# Patient Record
Sex: Female | Born: 1995 | Hispanic: Yes | Marital: Single | Smoking: Never smoker
Health system: Southern US, Community
[De-identification: ages and names within clinical notes are randomized; demographics above are authoritative.]

## PROBLEM LIST (undated history)

## (undated) ENCOUNTER — Inpatient Hospital Stay (HOSPITAL_COMMUNITY): Payer: Self-pay

## (undated) DIAGNOSIS — Z789 Other specified health status: Secondary | ICD-10-CM

## (undated) HISTORY — PX: CLEFT PALATE REPAIR: SUR1165

---

## 2015-02-06 ENCOUNTER — Emergency Department (HOSPITAL_COMMUNITY)
Admission: EM | Admit: 2015-02-06 | Discharge: 2015-02-06 | Disposition: A | Discharge status: Home / Self Care | Payer: Worker's Compensation | Attending: Emergency Medicine | Admitting: Emergency Medicine

## 2015-02-06 ENCOUNTER — Encounter (HOSPITAL_COMMUNITY): Payer: Self-pay

## 2015-02-06 DIAGNOSIS — S61512A Laceration without foreign body of left wrist, initial encounter: Secondary | ICD-10-CM | POA: Insufficient documentation

## 2015-02-06 DIAGNOSIS — S6992XA Unspecified injury of left wrist, hand and finger(s), initial encounter: Secondary | ICD-10-CM | POA: Diagnosis present

## 2015-02-06 DIAGNOSIS — Y9289 Other specified places as the place of occurrence of the external cause: Secondary | ICD-10-CM | POA: Insufficient documentation

## 2015-02-06 DIAGNOSIS — Y9389 Activity, other specified: Secondary | ICD-10-CM | POA: Diagnosis not present

## 2015-02-06 DIAGNOSIS — Y990 Civilian activity done for income or pay: Secondary | ICD-10-CM | POA: Insufficient documentation

## 2015-02-06 DIAGNOSIS — W500XXA Accidental hit or strike by another person, initial encounter: Secondary | ICD-10-CM | POA: Diagnosis not present

## 2015-02-06 DIAGNOSIS — Z23 Encounter for immunization: Secondary | ICD-10-CM | POA: Diagnosis not present

## 2015-02-06 DIAGNOSIS — T148XXA Other injury of unspecified body region, initial encounter: Secondary | ICD-10-CM

## 2015-02-06 MED ORDER — TETANUS-DIPHTH-ACELL PERTUSSIS 5-2.5-18.5 LF-MCG/0.5 IM SUSP
0.5000 mL | Freq: Once | INTRAMUSCULAR | Status: AC
  Administered 2015-02-06: 0.5 mL via INTRAMUSCULAR
  Filled 2015-02-06: qty 0.5

## 2015-02-06 NOTE — Discharge Instructions (Signed)
Read the information below.  You may return to the Emergency Department at any time for worsening condition or any new symptoms that concern you.  If you develop redness, swelling, pus draining from the wound, or fevers greater than 100.4, return to the ER immediately for a recheck.    Keep your wound clean and covered with a thin layer of antibiotic ointment.
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
## 2016-04-17 ENCOUNTER — Inpatient Hospital Stay (HOSPITAL_COMMUNITY)
Admission: AD | Admit: 2016-04-17 | Discharge: 2016-04-17 | Disposition: A | Discharge status: Home / Self Care | Payer: Self-pay | Source: Ambulatory Visit | Attending: Obstetrics & Gynecology | Admitting: Obstetrics & Gynecology

## 2016-04-17 DIAGNOSIS — Z3189 Encounter for other procreative management: Secondary | ICD-10-CM | POA: Insufficient documentation

## 2016-04-17 DIAGNOSIS — Z3490 Encounter for supervision of normal pregnancy, unspecified, unspecified trimester: Secondary | ICD-10-CM

## 2016-04-17 NOTE — MAU Provider Note (Signed)
S: pt in for preg test. Denies any complaints. O: VSS, alert and oriented x 3.  A; stable for d/c P: explained to pt that since she had no complaints that preg test were no done in MAU. Pt give handout of physician practices and d/c home

## 2016-04-17 NOTE — MAU Note (Signed)
Pt presents to MAU stating that she wants to confirm her pregnancy. Positive pregnancy test on March the 19th. Denies any vaginal bleeding or pain

## 2016-04-18 ENCOUNTER — Ambulatory Visit (INDEPENDENT_AMBULATORY_CARE_PROVIDER_SITE_OTHER): Payer: Self-pay | Admitting: *Deleted

## 2016-04-18 DIAGNOSIS — Z3200 Encounter for pregnancy test, result unknown: Secondary | ICD-10-CM

## 2016-04-18 DIAGNOSIS — Z3202 Encounter for pregnancy test, result negative: Secondary | ICD-10-CM

## 2016-04-18 LAB — POCT PREGNANCY, URINE: Preg Test, Ur: NEGATIVE

## 2016-04-18 NOTE — Progress Notes (Signed)
Patient came in to the office for a pregnancy test. Patient stated her LMP was 2/18, we did her urine pregnancy test and it came back negative.Patient stated she had 4 positive home pregnancy test, So we sent patient to the lab to have a BHCG drawn. Also we reviewed meds with patient.

## 2016-04-19 LAB — BETA HCG QUANT (REF LAB): hCG Quant: 36 m[IU]/mL

## 2016-04-20 ENCOUNTER — Telehealth: Payer: Self-pay | Admitting: *Deleted

## 2016-04-20 NOTE — Telephone Encounter (Signed)
Called pt and informed her of + pregnancy blood test. Per Dr. Vergie LivingPickens, pt may have repeat BHCG or urine pregnancy test if she is concerned. Otherwise, she may schedule prenatal care as desired. Pt denies abdominal pain or vaginal bleeding.  She wanted to know how far along is the pregnancy. I responded approximately 3 weeks which is very early - too early to be seen by ultrasound. She was advised to go to MAU for evaluation if she develops abdominal pain and/or vaginal bleeding.  Pt voiced understanding of all information and had no additional questions.

## 2016-06-10 ENCOUNTER — Encounter (HOSPITAL_COMMUNITY): Payer: Self-pay | Admitting: Emergency Medicine

## 2016-06-10 ENCOUNTER — Emergency Department (HOSPITAL_COMMUNITY)
Admission: EM | Admit: 2016-06-10 | Discharge: 2016-06-10 | Disposition: A | Discharge status: Home / Self Care | Payer: Self-pay | Attending: Emergency Medicine | Admitting: Emergency Medicine

## 2016-06-10 ENCOUNTER — Emergency Department (HOSPITAL_COMMUNITY): Payer: Self-pay

## 2016-06-10 DIAGNOSIS — O0281 Inappropriate change in quantitative human chorionic gonadotropin (hCG) in early pregnancy: Secondary | ICD-10-CM | POA: Insufficient documentation

## 2016-06-10 DIAGNOSIS — Z3A12 12 weeks gestation of pregnancy: Secondary | ICD-10-CM | POA: Insufficient documentation

## 2016-06-10 DIAGNOSIS — O034 Incomplete spontaneous abortion without complication: Secondary | ICD-10-CM | POA: Insufficient documentation

## 2016-06-10 LAB — WET PREP, GENITAL
Sperm: NONE SEEN
Trich, Wet Prep: NONE SEEN
YEAST WET PREP: NONE SEEN

## 2016-06-10 LAB — ABO/RH: ABO/RH(D): O POS

## 2016-06-10 LAB — RPR: RPR Ser Ql: NONREACTIVE

## 2016-06-10 LAB — RAPID HIV SCREEN (HIV 1/2 AB+AG)
HIV 1/2 Antibodies: NONREACTIVE
HIV-1 P24 Antigen - HIV24: NONREACTIVE

## 2016-06-10 LAB — GC/CHLAMYDIA PROBE AMP (~~LOC~~) NOT AT ARMC
CHLAMYDIA, DNA PROBE: NEGATIVE
NEISSERIA GONORRHEA: NEGATIVE

## 2016-06-10 LAB — HCG, QUANTITATIVE, PREGNANCY: hCG, Beta Chain, Quant, S: 10396 m[IU]/mL — ABNORMAL HIGH (ref <5)

## 2016-06-10 MED ORDER — CEFTRIAXONE SODIUM 250 MG IJ SOLR
250.0000 mg | Freq: Once | INTRAMUSCULAR | Status: AC
  Administered 2016-06-10: 250 mg via INTRAMUSCULAR
  Filled 2016-06-10: qty 250

## 2016-06-10 MED ORDER — LIDOCAINE HCL (PF) 1 % IJ SOLN
INTRAMUSCULAR | Status: AC
  Filled 2016-06-10: qty 5

## 2016-06-10 MED ORDER — AZITHROMYCIN 250 MG PO TABS
1000.0000 mg | ORAL_TABLET | Freq: Once | ORAL | Status: AC
  Administered 2016-06-10: 1000 mg via ORAL
  Filled 2016-06-10: qty 4

## 2016-06-10 NOTE — ED Provider Notes (Signed)
[facility] Provider Note   CSN: [id number] Arrival date & time: 06/10/16  6213     History   Chief Complaint Chief Complaint  Patient presents with  . Vaginal Bleeding    HPI Sandra Castillo is a 21 y.o. female.  HPI   21 year old female who was approximately [redacted] weeks pregnant presenting complaining of vaginal bleeding. Patient reports since 1 PM yesterday while she was at work she experiencing intermittent low abdominal pain. She described the pain as a sharp sensation, 3 out of 10, lasting for seconds, comes and goes. This morning she noticed a small amount of vaginal bleeding which concerns her. She denies any associated fever, lightheadedness, dizziness, chest pain, trouble breathing, back pain, dysuria, hematuria, vaginal discharge, or rash. Her last menstrual period was in February. She has not had a formal ultrasound. She is a G1 P0. She denies any recent injury or trauma. No specific treatment tried.  History reviewed. No pertinent past medical history.  There are no active problems to display for this patient.   History reviewed. No pertinent surgical history.  OB History    No data available       Home Medications    Prior to Admission medications   Medication Sig Start Date End Date Taking? Authorizing Provider  acetaminophen (TYLENOL) 160 MG/5ML elixir Take 325 mg by mouth every 4 (four) hours as needed for fever or pain.    [provider]    Family History Family History  Problem Relation Age of Onset  . Hypertension Father     Social History Social History  Substance Use Topics  . Smoking status: Never Smoker  . Smokeless tobacco: Never Used  . Alcohol use No     Allergies   Patient has no known allergies.   Review of Systems Review of Systems  All other systems reviewed and are negative.    Physical Exam Updated Vital Signs BP 116/72   Pulse 100   Temp 98.4 F (36.9 C) (Oral)   Resp 20   Ht 5\' 1"  (1.549 m)    Wt 145 lb (65.8 kg)   SpO2 99%   BMI 27.40 kg/m   Physical Exam  Constitutional: She appears well-developed and well-nourished. No distress.  HENT:  Head: Atraumatic.  Eyes: Conjunctivae are normal.  Neck: Neck supple.  Cardiovascular: Normal rate and regular rhythm.   Pulmonary/Chest: Effort normal and breath sounds normal.  Abdominal: Soft. She exhibits no distension.  Gravid abdomen consistence with date  Genitourinary:  Genitourinary Comments: Chaperone present during exam. No inguinal lymphadenopathy or inguinal hernia noted. Normal external genitalia. Moderate amount of blood noted in vaginal vault with small amount of clot. No significant discomfort with speculum insertion. Close cervical os free of lesion or rash. On bimanual examination no adnexal tenderness or cervical motion tenderness.  Neurological: She is alert.  Skin: No rash noted.  Psychiatric: She has a normal mood and affect.  Nursing note and vitals reviewed.    ED Treatments / Results  Labs (all labs ordered are listed, but only abnormal results are displayed) Labs Reviewed  WET PREP, GENITAL - Abnormal; Notable for the following:       Result Value   Clue Cells Wet Prep HPF POC PRESENT (*)    WBC, Wet Prep HPF POC MODERATE (*)    All other components within normal limits  HCG, QUANTITATIVE, PREGNANCY - Abnormal; Notable for the following:    hCG, Beta Chain, Quant, S 10,396 (*)  All other components within normal limits  RAPID HIV SCREEN (HIV 1/2 AB+AG)  RPR  ABO/RH  GC/CHLAMYDIA PROBE AMP ([place]) NOT AT [facility]    EKG  EKG Interpretation None       Radiology US Ob Comp < 14 Wks  Result Date: 06/10/2016 CLINICAL DATA:  Vaginal bleeding since 5 a.m. EXAM: OBSTETRIC <14 WK [place] AND TRANSVAGINAL OB US TECHNIQUE: Both transabdominal and transvaginal ultrasound examinations were performed for complete evaluation of the gestation as well as the maternal uterus, adnexal regions, and pelvic  cul-de-sac. Transvaginal technique was performed to assess early pregnancy. COMPARISON:  None. FINDINGS: The endometrial canal is distended by heterogeneous material, measuring up to 27 mm. No normal gestational sac is seen. This is likely predominantly hemorrhage, no internal vascularity is noted to suggest soft tissue mass, placenta or molar pregnancy. No adnexal mass or pelvic fluid. IMPRESSION: 1. Heterogeneous material diffusely distends the endometrial cavity by nearly 3 cm. The material is nonvascular and predominately attributed to hematoma. Placenta or other products of conception could easily be obscured, recommend follow-up. 2. No normal gestational sac identified. 3. No adnexal mass or pelvic fluid. Electronically Signed   By: Marnee Spring M.D.   On: 06/10/2016 11:42   US Ob Transvaginal  Result Date: 06/10/2016 CLINICAL DATA:  Vaginal bleeding since 5 a.m. EXAM: OBSTETRIC <14 WK [place] AND TRANSVAGINAL OB US TECHNIQUE: Both transabdominal and transvaginal ultrasound examinations were performed for complete evaluation of the gestation as well as the maternal uterus, adnexal regions, and pelvic cul-de-sac. Transvaginal technique was performed to assess early pregnancy. COMPARISON:  None. FINDINGS: The endometrial canal is distended by heterogeneous material, measuring up to 27 mm. No normal gestational sac is seen. This is likely predominantly hemorrhage, no internal vascularity is noted to suggest soft tissue mass, placenta or molar pregnancy. No adnexal mass or pelvic fluid. IMPRESSION: 1. Heterogeneous material diffusely distends the endometrial cavity by nearly 3 cm. The material is nonvascular and predominately attributed to hematoma. Placenta or other products of conception could easily be obscured, recommend follow-up. 2. No normal gestational sac identified. 3. No adnexal mass or pelvic fluid. Electronically Signed   By: Marnee Spring M.D.   On: 06/10/2016 11:42    Procedures Procedures  (including critical care time)  Medications Ordered in ED Medications  cefTRIAXone (ROCEPHIN) injection 250 mg (not administered)  azithromycin (ZITHROMAX) tablet 1,000 mg (not administered)     Initial Impression / Assessment and Plan / ED Course  I have reviewed the triage vital signs and the nursing notes.  Pertinent labs & imaging results that were available during my care of the patient were reviewed by me and considered in my medical decision making (see chart for details).     BP 118/76   Pulse 87   Temp 98.4 F (36.9 C) (Oral)   Resp 17   Ht 5\' 1"  (1.549 m)   Wt 145 lb (65.8 kg)   LMP 03/13/2016 (Exact Date)   SpO2 100%   BMI 27.40 kg/m    Final Clinical Impressions(s) / ED Diagnoses   Final diagnoses:  Incomplete abortion    New Prescriptions New Prescriptions   No medications on file   6:57 AM Vaginal bleeding in first trimester.  Work up initiated.  A perform a screening bedside ultrasound. I was able to identify the uterus, but unable to visualize the fetus. Patient is stable, workup initiated. Will obtain formal ultrasound.  12:12 PM Beta hCG is 10,396 which is lower  than expected progression. Patient is Rh+. Wet prep shows persistent clue cells, moderate amount of WBC. Patient will be getting Rocephin and Zithromax for potential STD.  patient ultrasound demonstrate heterogeneous material diffusely distended in the endometrial cavity by nearly 3 cm.cyst likely a hematoma. Placenta or products of conception could easily be obscured. Recommend follow-up. No normal gestational sac identified. This finding was discussed with patient. She does have a appointment with an OB/GYN at the health clinic on Monday which is 3 days now. I encouraged patient to follow-up for a recheck. All questions answered to patient's satisfaction. She is stable for discharge.    Fayrene Helperran, Rosamae Rocque, PA-C 06/10/16 1217    Gerhard MunchLockwood, Robert, MD 06/10/16 [phone]

## 2016-06-10 NOTE — Discharge Instructions (Signed)
Your finding is concerning for incomplete miscarriage.  Please follow up with your schedule appointment on Monday for reevaluation.

## 2016-06-10 NOTE — ED Triage Notes (Signed)
Pt is approx [redacted] weeks pregnant and started experiencing vaginal bleeding around 0500 this am. Pt is having pelvic pain also

## 2016-06-10 NOTE — ED Notes (Signed)
Patient transported to Ultrasound 

## 2016-06-13 ENCOUNTER — Inpatient Hospital Stay (HOSPITAL_COMMUNITY)
Admission: AD | Admit: 2016-06-13 | Discharge: 2016-06-13 | Disposition: A | Discharge status: Home / Self Care | Payer: Self-pay | Source: Ambulatory Visit | Attending: Obstetrics & Gynecology | Admitting: Obstetrics & Gynecology

## 2016-06-13 ENCOUNTER — Encounter (HOSPITAL_COMMUNITY): Payer: Self-pay | Admitting: *Deleted

## 2016-06-13 DIAGNOSIS — O039 Complete or unspecified spontaneous abortion without complication: Secondary | ICD-10-CM | POA: Insufficient documentation

## 2016-06-13 DIAGNOSIS — O209 Hemorrhage in early pregnancy, unspecified: Secondary | ICD-10-CM

## 2016-06-13 DIAGNOSIS — Z3A13 13 weeks gestation of pregnancy: Secondary | ICD-10-CM | POA: Insufficient documentation

## 2016-06-13 HISTORY — DX: Other specified health status: Z78.9

## 2016-06-13 LAB — CBC
HCT: 37.5 % (ref 36.0–46.0)
Hemoglobin: 12.9 g/dL (ref 12.0–15.0)
MCH: 31.2 pg (ref 26.0–34.0)
MCHC: 34.4 g/dL (ref 30.0–36.0)
MCV: 90.6 fL (ref 78.0–100.0)
PLATELETS: 294 10*3/uL (ref 150–400)
RBC: 4.14 MIL/uL (ref 3.87–5.11)
RDW: 12.8 % (ref 11.5–15.5)
WBC: 8.8 10*3/uL (ref 4.0–10.5)

## 2016-06-13 LAB — HCG, QUANTITATIVE, PREGNANCY: hCG, Beta Chain, Quant, S: 4092 m[IU]/mL — ABNORMAL HIGH (ref <5)

## 2016-06-13 MED ORDER — IBUPROFEN 600 MG PO TABS
600.0000 mg | ORAL_TABLET | Freq: Once | ORAL | Status: AC
  Administered 2016-06-13: 600 mg via ORAL
  Filled 2016-06-13: qty 1

## 2016-06-13 MED ORDER — IBUPROFEN 600 MG PO TABS: 600.0000 mg | ORAL_TABLET | Freq: Once | ORAL | 0 refills | Status: AC

## 2016-06-13 NOTE — MAU Note (Signed)
[facility] . Was told she had an incomplete miscarriage. Went to appointment wtih health department today and they sent her here  Because she was still having pain and bleeding.

## 2016-06-13 NOTE — MAU Provider Note (Signed)
History     CSN: [id number]  Arrival date and time: 06/13/16 1024   First Provider Initiated Contact with Patient 06/13/16 1225      Chief Complaint  Patient presents with  . Vaginal Bleeding  . Miscarriage   HPI   Ms.Sandra Castillo is a 21 y.o. female G1P0 @ 3951w1d here in MAU with vaginal bleeding and concerns about miscarriage. She presented to the ED on 5/18 with vaginal bleeding similar to a menstrual cycle She had an episode of lower abdominal pain at the onset of bleeding. She was told that she was likely having a miscarriage and needed to be seen by a OBGYN. She had already had an appointment with the Health Department today to establish prenatal care. She informed them that she continued to have vaginal bleeding and cramping and they recommended she come to MAU for further evaluation.   She continues to have dark red vaginal bleeding. She denies dizziness. She continues to have lower abdominal pain that is mild. She has not taken anything for pain recently.   OB History    Gravida Para Term Preterm AB Living   1             SAB TAB Ectopic Multiple Live Births                  Past Medical History:  Diagnosis Date  . Medical history non-contributory     No past surgical history on file.  Family History  Problem Relation Age of Onset  . Hypertension Father     Social History  Substance Use Topics  . Smoking status: Never Smoker  . Smokeless tobacco: Never Used  . Alcohol use No    Allergies: No Known Allergies  Prescriptions Prior to Admission  Medication Sig Dispense Refill Last Dose  . Prenatal Vit-Fe Fumarate-FA (PRENATAL MULTIVITAMIN) TABS tablet Take 1 tablet by mouth daily at 12 noon.   Past Week at Unknown time   Results for orders placed or performed during the hospital encounter of 06/13/16 (from the past 48 hour(s))  hCG, quantitative, pregnancy     Status: Abnormal   Collection Time: 06/13/16 12:34 PM  Result Value Ref Range   hCG, Beta  Chain, Quant, S 4,092 (H) <5 mIU/mL    Comment:          GEST. AGE      CONC.  (mIU/mL)   <=1 WEEK        5 - 50     2 WEEKS       50 - 500     3 WEEKS       100 - 10,000     4 WEEKS     1,000 - 30,000     5 WEEKS     3,500 - 115,000   6-8 WEEKS     12,000 - 270,000    12 WEEKS     15,000 - 220,000        FEMALE AND NON-PREGNANT FEMALE:     LESS THAN 5 mIU/mL   CBC     Status: None   Collection Time: 06/13/16 12:34 PM  Result Value Ref Range   WBC 8.8 4.0 - 10.5 K/uL   RBC 4.14 3.87 - 5.11 MIL/uL   Hemoglobin 12.9 12.0 - 15.0 g/dL   HCT 62.937.5 52.836.0 - 41.346.0 %   MCV 90.6 78.0 - 100.0 fL   MCH 31.2 26.0 - 34.0 pg   MCHC 34.4 30.0 -  36.0 g/dL   RDW 84.6 96.2 - 95.2 %   Platelets 294 150 - 400 K/uL    US Ob Comp < 14 Wks  Result Date: 06/10/2016 CLINICAL DATA:  Vaginal bleeding since 5 a.m. EXAM: OBSTETRIC <14 WK [place] AND TRANSVAGINAL OB US TECHNIQUE: Both transabdominal and transvaginal ultrasound examinations were performed for complete evaluation of the gestation as well as the maternal uterus, adnexal regions, and pelvic cul-de-sac. Transvaginal technique was performed to assess early pregnancy. COMPARISON:  None. FINDINGS: The endometrial canal is distended by heterogeneous material, measuring up to 27 mm. No normal gestational sac is seen. This is likely predominantly hemorrhage, no internal vascularity is noted to suggest soft tissue mass, placenta or molar pregnancy. No adnexal mass or pelvic fluid. IMPRESSION: 1. Heterogeneous material diffusely distends the endometrial cavity by nearly 3 cm. The material is nonvascular and predominately attributed to hematoma. Placenta or other products of conception could easily be obscured, recommend follow-up. 2. No normal gestational sac identified. 3. No adnexal mass or pelvic fluid. Electronically Signed   By: Marnee Spring M.D.   On: 06/10/2016 11:42   US Ob Transvaginal  Result Date: 06/10/2016 CLINICAL DATA:  Vaginal bleeding since 5 a.m.  EXAM: OBSTETRIC <14 WK [place] AND TRANSVAGINAL OB US TECHNIQUE: Both transabdominal and transvaginal ultrasound examinations were performed for complete evaluation of the gestation as well as the maternal uterus, adnexal regions, and pelvic cul-de-sac. Transvaginal technique was performed to assess early pregnancy. COMPARISON:  None. FINDINGS: The endometrial canal is distended by heterogeneous material, measuring up to 27 mm. No normal gestational sac is seen. This is likely predominantly hemorrhage, no internal vascularity is noted to suggest soft tissue mass, placenta or molar pregnancy. No adnexal mass or pelvic fluid. IMPRESSION: 1. Heterogeneous material diffusely distends the endometrial cavity by nearly 3 cm. The material is nonvascular and predominately attributed to hematoma. Placenta or other products of conception could easily be obscured, recommend follow-up. 2. No normal gestational sac identified. 3. No adnexal mass or pelvic fluid. Electronically Signed   By: Marnee Spring M.D.   On: 06/10/2016 11:42   Review of Systems  Constitutional: Negative for fever.  Gastrointestinal: Positive for abdominal pain.  Genitourinary: Positive for vaginal bleeding.  Neurological: Negative for dizziness.   Physical Exam   Blood pressure 124/73, pulse 67, temperature 98.3 F (36.8 C), temperature source Oral, resp. rate 16, height 5\' 2"  (1.575 m), weight 144 lb (65.3 kg), last menstrual period 03/13/2016, SpO2 100 %.  Physical Exam  Constitutional: She is oriented to person, place, and time. She appears well-developed and well-nourished. No distress.  HENT:  Head: Normocephalic.  Eyes: Pupils are equal, round, and reactive to light.  GI: Soft. She exhibits no distension. There is no tenderness. There is no rebound.  Genitourinary:  Genitourinary Comments: Vagina - Small amount of dark red blood noted in vaginal canal  Cervix - small amount of active bleeding  Bimanual exam: Cervix closed Uterus  non tender, enlarged  Chaperone present for exam.   Musculoskeletal: Normal range of motion.  Neurological: She is alert and oriented to person, place, and time.  Skin: Skin is warm. She is not diaphoretic.  Psychiatric: Her behavior is normal.   MAU Course  Procedures  None  MDM  ED records from 5/18 fully reviewed Quant 5/18: 10,396 Quant today 5/21: 4,092 Hgb stable today  Ibuprofen 600 mg PO given in MAU.  O positive blood type   Assessment and Plan   A:  1. SAB (spontaneous abortion)   2. Vaginal bleeding in pregnancy, first trimester     P:  Discharge home in stable condition Message sent to the [facility] for follow up in 1 week  for quant  Bleeding precautions Return to MAU if symptoms worsen Pelvic rest  Support given    Venia Carbon I, NP 06/13/2016 8:06 PM

## 2016-06-13 NOTE — Discharge Instructions (Signed)

## 2016-06-13 NOTE — MAU Note (Signed)
Urine in lab 

## 2016-06-21 ENCOUNTER — Other Ambulatory Visit: Payer: Self-pay

## 2016-06-27 ENCOUNTER — Ambulatory Visit (INDEPENDENT_AMBULATORY_CARE_PROVIDER_SITE_OTHER): Payer: Self-pay | Admitting: Family Medicine

## 2016-06-27 ENCOUNTER — Encounter: Payer: Self-pay | Admitting: Family Medicine

## 2016-06-27 VITALS — BP 122/72 | HR 82 | Ht 61.0 in | Wt 146.3 lb

## 2016-06-27 DIAGNOSIS — O039 Complete or unspecified spontaneous abortion without complication: Secondary | ICD-10-CM

## 2016-06-27 NOTE — Progress Notes (Signed)
   Subjective:    Patient ID: Sandra Castillo is a 21 y.o. female presenting with Follow-up (sab)  on 06/27/2016  HPI: Still having some bleeding from her miscarriage. Works as [employer] for [employer]. [facility] [employer] [facility] education. Not interested in contraception at this time due to side effects.  Review of Systems  Constitutional: Negative for chills and fever.  Respiratory: Negative for shortness of breath.   Cardiovascular: Negative for chest pain.  Gastrointestinal: Negative for abdominal pain, nausea and vomiting.  Genitourinary: Negative for dysuria.  Skin: Negative for rash.      Objective:    BP 122/72   Pulse 82   Ht 5\' 1"  (1.549 m)   Wt 146 lb 4.8 oz (66.4 kg)   LMP 03/13/2016   Breastfeeding? No   BMI 27.64 kg/m  Physical Exam  Constitutional: She is oriented to person, place, and time. She appears well-developed and well-nourished. No distress.  HENT:  Head: Normocephalic and atraumatic.  Eyes: No scleral icterus.  Neck: Neck supple.  Cardiovascular: Normal rate.   Pulmonary/Chest: Effort normal.  Abdominal: Soft.  Neurological: She is alert and oriented to person, place, and time.  Skin: Skin is warm and dry.  Psychiatric: She has a normal mood and affect.      Assessment & Plan:  Miscarriage - needs pap and CPE--apply for Medicaid FPW, declines contraception but info given--revisit at next visit.   Total face-to-face time with patient: 15 minutes. Over 50% of encounter was spent on counseling and coordination of care. Return in about 4 weeks (around 07/25/2016) for a CPE.  Reva Boresanya S Dianne Bady 06/27/2016 3:53 PM

## 2016-06-27 NOTE — Patient Instructions (Signed)

## 2016-07-04 ENCOUNTER — Encounter: Payer: Self-pay | Admitting: Family Medicine

## 2016-07-19 ENCOUNTER — Encounter: Payer: Self-pay | Admitting: Family Medicine

## 2016-07-25 ENCOUNTER — Ambulatory Visit: Payer: Self-pay | Admitting: Family Medicine

## 2016-07-26 ENCOUNTER — Ambulatory Visit (INDEPENDENT_AMBULATORY_CARE_PROVIDER_SITE_OTHER): Payer: Self-pay | Admitting: Advanced Practice Midwife

## 2016-07-26 ENCOUNTER — Encounter: Payer: Self-pay | Admitting: Advanced Practice Midwife

## 2016-07-26 VITALS — BP 119/79 | HR 111 | Wt 133.5 lb

## 2016-07-26 DIAGNOSIS — O039 Complete or unspecified spontaneous abortion without complication: Secondary | ICD-10-CM

## 2016-07-26 DIAGNOSIS — N939 Abnormal uterine and vaginal bleeding, unspecified: Secondary | ICD-10-CM

## 2016-07-26 NOTE — Progress Notes (Signed)
Patient ID: Sandra Castillo, female   DOB: 02/26/1995, 21 y.o.   MRN: [id number]  Chief Complaint  Patient presents with  . Vaginal Bleeding    HPI  Sandra Castillo is a 21 y.o. female. She presents today for follow up after a SAB. She reports that she has had bleeding every day since she was seen 06/27/16. She states that it has been spotting, but about 3 days ago it became heavier. She states that it is less than a period at this time. She denies passing any clots or tissue. She denies any pain.    Vaginal Bleeding  The patient's primary symptoms include vaginal bleeding. The patient's pertinent negatives include no pelvic pain or vaginal discharge. This is a new problem. The current episode started more than 1 month ago. The problem occurs constantly. The problem has been waxing and waning. The patient is experiencing no pain. She is not pregnant (recent SAB). Pertinent negatives include no chills, dysuria, fever, nausea or vomiting. The vaginal bleeding is lighter than menses. She has not been passing clots. She has not been passing tissue. Nothing aggravates the symptoms. She has tried nothing for the symptoms. She uses nothing for contraception.    Past Medical History:  Diagnosis Date  . Medical history non-contributory     No past surgical history on file.  Family History  Problem Relation Age of Onset  . Hypertension Father     Social History Social History  Substance Use Topics  . Smoking status: Never Smoker  . Smokeless tobacco: Never Used  . Alcohol use No    No Known Allergies  Current Outpatient Prescriptions  Medication Sig Dispense Refill  . Prenatal Vit-Fe Fumarate-FA (PRENATAL MULTIVITAMIN) TABS tablet Take 1 tablet by mouth daily at 12 noon.     No current facility-administered medications for this visit.     Review of Systems Review of Systems  Constitutional: Negative for chills and fever.  Gastrointestinal: Negative for nausea and vomiting.   Genitourinary: Positive for vaginal bleeding. Negative for dysuria, pelvic pain and vaginal discharge.    Blood pressure 119/79, pulse (!) 111, weight 133 lb 8 oz (60.6 kg), last menstrual period 03/13/2016.  Physical Exam Physical Exam  Constitutional: She is oriented to person, place, and time. She appears well-developed and well-nourished. No distress.  HENT:  Head: Normocephalic.  Cardiovascular: Normal rate.   Pulmonary/Chest: Effort normal.  Abdominal: Soft. There is no tenderness. There is no rebound.  Genitourinary:  Genitourinary Comments:  External: no lesion Vagina: small amount of pink blood seen  Cervix: pink, smooth, no CMT Uterus: NSSC Adnexa: NT  Neurological: She is alert and oriented to person, place, and time.  Skin: Skin is warm and dry.  Psychiatric: She has a normal mood and affect.  Nursing note and vitals reviewed.   Data Reviewed Reviewed labs from previous visit.   Assessment    1. Abnormal uterine bleeding   2. SAB (spontaneous abortion)        Plan    HCG today Will call patient with results May need US if HCG still present to look for retained POCs If no HCG reviewed with the patient that bleeding may be irregular after a SAB, and will continue to monitor.  Patient given information on free pap clinic, pap deferred today as patient does not have insurance.        Thressa ShellerHeather Tyjanae Bartek 07/26/2016, 2:27 PM

## 2016-07-26 NOTE — Progress Notes (Signed)
Free Pap Screening info given to pt  

## 2016-07-27 LAB — BETA HCG QUANT (REF LAB)

## 2016-07-28 ENCOUNTER — Encounter: Payer: Self-pay | Admitting: Advanced Practice Midwife

## 2016-08-04 ENCOUNTER — Ambulatory Visit: Payer: Self-pay | Admitting: Family Medicine

## 2017-01-24 NOTE — L&D Delivery Note (Signed)
[facility] Delivery Note  Sandra Castillo is a 22 y.o. G2P0010 s/p SVD at [redacted]w[redacted]d. She was admitted for spontaneous onset of labor.   ROM: 0h 59m with clear fluid GBS Status: negative Maximum Maternal Temperature: Temp (24hrs), Avg:98.2 F (36.8 C), Min:98.2 F (36.8 C), Max:98.2 F (36.8 C)  Labor Progress: . Admitted in active labor . Progressed to complete with AROM just prior to pushing   Delivery Date/Time: 10/24/17 at 0309  Delivery: AROM in room and then head to +2 station and patient with urge to push. Remained in room during pushing which was for about 30 minutes under no anesthesia. Head delivered ROA. No nuchal cord present but loose body cord. Shoulder and body delivered in usual fashion. Infant with spontaneous cry, placed on mother's abdomen, dried and stimulated. Cord clamped x 2 after 1-minute delay, and cut by maternal grandmother of baby. Cord blood drawn. Placenta delivered spontaneously with gentle cord traction. Fundus firm with massage and Pitocin. Labia, perineum, vagina, and cervix inspected inspected with 2nd degree and right periurethral lacerations.   Placenta: spontaneous, intact, 3-vessel cord Complications: none Lacerations: 2nd degree, periurethral repaired with 3-0 Vicryl and 4-0 rapide  EBL: 785cc - PPH related to tone, lacerations, small clots in lower uterine segment expelled with fundal massage   uterus firm with postpartum pitocin after completion of repair   Postpartum Planning [x]  message to sent to schedule follow-up  [x]  vaccines UTD  Infant: Vigorous female  APGARs 52, 75   3161g  Sandra Castillo S. Earlene Plater, DO OB/GYN Fellow, Faculty [facility]

## 2017-03-27 ENCOUNTER — Emergency Department (HOSPITAL_COMMUNITY): Payer: No Typology Code available for payment source

## 2017-03-27 ENCOUNTER — Emergency Department (HOSPITAL_COMMUNITY)
Admission: EM | Admit: 2017-03-27 | Discharge: 2017-03-27 | Disposition: A | Discharge status: Home / Self Care | Payer: No Typology Code available for payment source | Attending: Emergency Medicine | Admitting: Emergency Medicine

## 2017-03-27 ENCOUNTER — Encounter (HOSPITAL_COMMUNITY): Payer: Self-pay | Admitting: Emergency Medicine

## 2017-03-27 DIAGNOSIS — Y92410 Unspecified street and highway as the place of occurrence of the external cause: Secondary | ICD-10-CM | POA: Diagnosis not present

## 2017-03-27 DIAGNOSIS — S60221A Contusion of right hand, initial encounter: Secondary | ICD-10-CM | POA: Diagnosis not present

## 2017-03-27 DIAGNOSIS — Y999 Unspecified external cause status: Secondary | ICD-10-CM | POA: Insufficient documentation

## 2017-03-27 DIAGNOSIS — Y9389 Activity, other specified: Secondary | ICD-10-CM | POA: Diagnosis not present

## 2017-03-27 DIAGNOSIS — S6000XA Contusion of unspecified finger without damage to nail, initial encounter: Secondary | ICD-10-CM

## 2017-03-27 DIAGNOSIS — S6991XA Unspecified injury of right wrist, hand and finger(s), initial encounter: Secondary | ICD-10-CM | POA: Diagnosis present

## 2017-03-27 DIAGNOSIS — S63619A Unspecified sprain of unspecified finger, initial encounter: Secondary | ICD-10-CM | POA: Diagnosis not present

## 2017-03-27 NOTE — ED Notes (Signed)
Discharge instructions reviewed with patient. Patient verbalizes understanding. VSS.   

## 2017-03-27 NOTE — Discharge Instructions (Signed)
Use tylenol for pain, this is the only medication for pain that is safe in pregnancy. You can buddy tape your fingers for comfort. Ice and elevate your hand/fingers to help with pain/swelling. Use the ice pack for no more than 20 minutes at a time every hour. Expect to be sore for the next few days and follow up with your primary care physician for recheck of ongoing symptoms in the next 1week. Return to ER for emergent changing or worsening of symptoms.
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
## 2017-04-13 ENCOUNTER — Other Ambulatory Visit (HOSPITAL_COMMUNITY): Payer: Self-pay | Admitting: Nurse Practitioner

## 2017-04-13 DIAGNOSIS — Z3682 Encounter for antenatal screening for nuchal translucency: Secondary | ICD-10-CM

## 2017-04-13 LAB — OB RESULTS CONSOLE RPR: RPR: NONREACTIVE

## 2017-04-13 LAB — OB RESULTS CONSOLE ABO/RH: RH TYPE: POSITIVE

## 2017-04-13 LAB — OB RESULTS CONSOLE RUBELLA ANTIBODY, IGM: Rubella: NON-IMMUNE/NOT IMMUNE

## 2017-04-13 LAB — OB RESULTS CONSOLE HIV ANTIBODY (ROUTINE TESTING): HIV: NONREACTIVE

## 2017-04-13 LAB — OB RESULTS CONSOLE ANTIBODY SCREEN: Antibody Screen: NEGATIVE

## 2017-04-13 LAB — OB RESULTS CONSOLE GC/CHLAMYDIA
Chlamydia: NEGATIVE
Gonorrhea: NEGATIVE

## 2017-04-13 LAB — OB RESULTS CONSOLE HEPATITIS B SURFACE ANTIGEN: Hepatitis B Surface Ag: NEGATIVE

## 2017-04-26 ENCOUNTER — Encounter (HOSPITAL_COMMUNITY): Payer: Self-pay | Admitting: *Deleted

## 2017-04-28 ENCOUNTER — Ambulatory Visit (HOSPITAL_COMMUNITY)
Admission: RE | Admit: 2017-04-28 | Discharge: 2017-04-28 | Disposition: A | Discharge status: Home / Self Care | Payer: Self-pay | Source: Ambulatory Visit | Attending: Nurse Practitioner | Admitting: Nurse Practitioner

## 2017-04-28 ENCOUNTER — Encounter (HOSPITAL_COMMUNITY): Payer: Self-pay

## 2017-04-28 DIAGNOSIS — Z315 Encounter for genetic counseling: Secondary | ICD-10-CM | POA: Insufficient documentation

## 2017-04-28 DIAGNOSIS — Z3A12 12 weeks gestation of pregnancy: Secondary | ICD-10-CM | POA: Insufficient documentation

## 2017-04-28 DIAGNOSIS — Z87730 Personal history of (corrected) cleft lip and palate: Secondary | ICD-10-CM | POA: Insufficient documentation

## 2017-04-28 DIAGNOSIS — Z87798 Personal history of other (corrected) congenital malformations: Secondary | ICD-10-CM | POA: Insufficient documentation

## 2017-04-28 DIAGNOSIS — Z3682 Encounter for antenatal screening for nuchal translucency: Secondary | ICD-10-CM | POA: Insufficient documentation
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
## 2017-05-12 ENCOUNTER — Other Ambulatory Visit (HOSPITAL_COMMUNITY): Payer: Self-pay

## 2017-10-10 LAB — OB RESULTS CONSOLE GBS: STREP GROUP B AG: NEGATIVE

## 2017-10-23 ENCOUNTER — Inpatient Hospital Stay (HOSPITAL_COMMUNITY)
Admission: AD | Admit: 2017-10-23 | Discharge: 2017-10-26 | DRG: 806 | Disposition: A | Discharge status: Home / Self Care | Payer: Medicaid Other | Attending: Obstetrics and Gynecology | Admitting: Obstetrics and Gynecology

## 2017-10-23 DIAGNOSIS — Z3A38 38 weeks gestation of pregnancy: Secondary | ICD-10-CM

## 2017-10-23 DIAGNOSIS — O479 False labor, unspecified: Secondary | ICD-10-CM | POA: Diagnosis present

## 2017-10-23 DIAGNOSIS — Z87730 Personal history of (corrected) cleft lip and palate: Secondary | ICD-10-CM

## 2017-10-23 DIAGNOSIS — Z23 Encounter for immunization: Secondary | ICD-10-CM

## 2017-10-24 ENCOUNTER — Encounter (HOSPITAL_COMMUNITY): Payer: Self-pay | Admitting: *Deleted

## 2017-10-24 ENCOUNTER — Other Ambulatory Visit: Payer: Self-pay

## 2017-10-24 DIAGNOSIS — Z3A38 38 weeks gestation of pregnancy: Secondary | ICD-10-CM | POA: Diagnosis not present

## 2017-10-24 DIAGNOSIS — O479 False labor, unspecified: Secondary | ICD-10-CM | POA: Diagnosis present

## 2017-10-24 DIAGNOSIS — Z3483 Encounter for supervision of other normal pregnancy, third trimester: Secondary | ICD-10-CM | POA: Diagnosis present

## 2017-10-24 DIAGNOSIS — Z23 Encounter for immunization: Secondary | ICD-10-CM | POA: Diagnosis not present

## 2017-10-24 LAB — CBC
HCT: 35.6 % — ABNORMAL LOW (ref 36.0–46.0)
HEMOGLOBIN: 12.6 g/dL (ref 12.0–15.0)
MCH: 32.7 pg (ref 26.0–34.0)
MCHC: 35.4 g/dL (ref 30.0–36.0)
MCV: 92.5 fL (ref 78.0–100.0)
Platelets: 252 10*3/uL (ref 150–400)
RBC: 3.85 MIL/uL — AB (ref 3.87–5.11)
RDW: 12.8 % (ref 11.5–15.5)
WBC: 12.7 10*3/uL — AB (ref 4.0–10.5)

## 2017-10-24 LAB — ABO/RH: ABO/RH(D): O POS

## 2017-10-24 LAB — TYPE AND SCREEN
ABO/RH(D): O POS
ANTIBODY SCREEN: NEGATIVE

## 2017-10-24 LAB — RPR: RPR Ser Ql: NONREACTIVE

## 2017-10-24 MED ORDER — LIDOCAINE HCL (PF) 1 % IJ SOLN
30.0000 mL | INTRAMUSCULAR | Status: DC | PRN
Start: 2017-10-24 — End: 2017-10-26
  Filled 2017-10-24: qty 30

## 2017-10-24 MED ORDER — ONDANSETRON HCL 4 MG/2ML IJ SOLN: 4.0000 mg | INTRAMUSCULAR | Status: DC | PRN

## 2017-10-24 MED ORDER — LACTATED RINGERS IV SOLN: INTRAVENOUS | Status: DC

## 2017-10-24 MED ORDER — OXYTOCIN 40 UNITS IN LACTATED RINGERS INFUSION - SIMPLE MED: 2.5000 [IU]/h | INTRAVENOUS | Status: DC

## 2017-10-24 MED ORDER — SENNOSIDES-DOCUSATE SODIUM 8.6-50 MG PO TABS
2.0000 | ORAL_TABLET | ORAL | Status: DC
  Administered 2017-10-24 – 2017-10-25 (×2): 2 via ORAL
  Filled 2017-10-24: qty 2

## 2017-10-24 MED ORDER — SIMETHICONE 80 MG PO CHEW: 80.0000 mg | CHEWABLE_TABLET | ORAL | Status: DC | PRN

## 2017-10-24 MED ORDER — ZOLPIDEM TARTRATE 5 MG PO TABS: 5.0000 mg | ORAL_TABLET | Freq: Every evening | ORAL | Status: DC | PRN

## 2017-10-24 MED ORDER — DIBUCAINE 1 % RE OINT: 1.0000 "application " | TOPICAL_OINTMENT | RECTAL | Status: DC | PRN

## 2017-10-24 MED ORDER — PRENATAL MULTIVITAMIN CH
1.0000 | ORAL_TABLET | Freq: Every day | ORAL | Status: DC
  Administered 2017-10-24 – 2017-10-26 (×3): 1 via ORAL
  Filled 2017-10-24 (×3): qty 1

## 2017-10-24 MED ORDER — LACTATED RINGERS IV SOLN: 500.0000 mL | INTRAVENOUS | Status: DC | PRN

## 2017-10-24 MED ORDER — BENZOCAINE-MENTHOL 20-0.5 % EX AERO: 1.0000 "application " | INHALATION_SPRAY | CUTANEOUS | Status: DC | PRN

## 2017-10-24 MED ORDER — SOD CITRATE-CITRIC ACID 500-334 MG/5ML PO SOLN: 30.0000 mL | ORAL | Status: DC | PRN

## 2017-10-24 MED ORDER — DIPHENHYDRAMINE HCL 25 MG PO CAPS: 25.0000 mg | ORAL_CAPSULE | Freq: Four times a day (QID) | ORAL | Status: DC | PRN

## 2017-10-24 MED ORDER — OXYTOCIN BOLUS FROM INFUSION: 500.0000 mL | Freq: Once | INTRAVENOUS | Status: DC

## 2017-10-24 MED ORDER — ONDANSETRON HCL 4 MG PO TABS: 4.0000 mg | ORAL_TABLET | ORAL | Status: DC | PRN

## 2017-10-24 MED ORDER — OXYCODONE-ACETAMINOPHEN 5-325 MG PO TABS: 2.0000 | ORAL_TABLET | ORAL | Status: DC | PRN

## 2017-10-24 MED ORDER — COCONUT OIL OIL: 1.0000 "application " | TOPICAL_OIL | Status: DC | PRN

## 2017-10-24 MED ORDER — WITCH HAZEL-GLYCERIN EX PADS: 1.0000 "application " | MEDICATED_PAD | CUTANEOUS | Status: DC | PRN

## 2017-10-24 MED ORDER — LIDOCAINE HCL (PF) 1 % IJ SOLN
30.0000 mL | INTRAMUSCULAR | Status: AC | PRN
  Administered 2017-10-24: 30 mL via SUBCUTANEOUS
  Filled 2017-10-24: qty 30

## 2017-10-24 MED ORDER — FLEET ENEMA 7-19 GM/118ML RE ENEM: 1.0000 | ENEMA | RECTAL | Status: DC | PRN

## 2017-10-24 MED ORDER — OXYTOCIN 40 UNITS IN LACTATED RINGERS INFUSION - SIMPLE MED
INTRAVENOUS | Status: AC
  Filled 2017-10-24: qty 1000

## 2017-10-24 MED ORDER — ONDANSETRON HCL 4 MG/2ML IJ SOLN: 4.0000 mg | Freq: Four times a day (QID) | INTRAMUSCULAR | Status: DC | PRN

## 2017-10-24 MED ORDER — FENTANYL CITRATE (PF) 100 MCG/2ML IJ SOLN: 100.0000 ug | INTRAMUSCULAR | Status: DC | PRN

## 2017-10-24 MED ORDER — IBUPROFEN 600 MG PO TABS
600.0000 mg | ORAL_TABLET | Freq: Four times a day (QID) | ORAL | Status: DC
  Administered 2017-10-24 – 2017-10-26 (×9): 600 mg via ORAL
  Filled 2017-10-24 (×8): qty 1

## 2017-10-24 MED ORDER — ACETAMINOPHEN 325 MG PO TABS: 650.0000 mg | ORAL_TABLET | ORAL | Status: DC | PRN

## 2017-10-24 MED ORDER — OXYCODONE-ACETAMINOPHEN 5-325 MG PO TABS: 1.0000 | ORAL_TABLET | ORAL | Status: DC | PRN

## 2017-10-24 MED ORDER — INFLUENZA VAC SPLIT QUAD 0.5 ML IM SUSY
0.5000 mL | PREFILLED_SYRINGE | INTRAMUSCULAR | Status: AC
  Administered 2017-10-24: 0.5 mL via INTRAMUSCULAR
  Filled 2017-10-24: qty 0.5

## 2017-10-24 MED ORDER — LIDOCAINE HCL (PF) 1 % IJ SOLN
INTRAMUSCULAR | Status: AC
  Filled 2017-10-24: qty 30

## 2017-10-24 MED ORDER — OXYTOCIN BOLUS FROM INFUSION
500.0000 mL | Freq: Once | INTRAVENOUS | Status: AC
  Administered 2017-10-24: 500 mL via INTRAVENOUS

## 2017-10-24 MED ORDER — LACTATED RINGERS IV SOLN
INTRAVENOUS | Status: DC
  Administered 2017-10-24: 01:00:00 via INTRAVENOUS

## 2017-10-24 NOTE — H&P (Signed)
OBSTETRIC ADMISSION HISTORY AND PHYSICAL  Sandra Castillo is a 22 y.o. female G2P0010 with IUP at [redacted]w[redacted]d by L/12 presenting for spontaneous onset of labor.   Reports fetal movement. Denies vaginal bleeding, leakage of fluids.  She received her prenatal care at [facility].  Support person in labor: partner, mother  Ultrasounds . Anatomy U/S: bilateral renal pyelectasis  - resolved no later ultrasound (report in media tab)  Prenatal History/Complications: . Rubella non-immune . Anemia . Resolved fetal pyelectasis   Past Medical History: Past Medical History:  Diagnosis Date  . Medical history non-contributory     Past Surgical History: Past Surgical History:  Procedure Laterality Date  . CLEFT PALATE REPAIR      Obstetrical History: OB History    Gravida  2   Para      Term      Preterm      AB  1   Living  0     SAB  1   TAB      Ectopic      Multiple      Live Births              Social History: Social History   Socioeconomic History  . Marital status: Single    Spouse name: Not on file  . Number of children: Not on file  . Years of education: Not on file  . Highest education level: Not on file  Occupational History  . Not on file  Social Needs  . Financial resource strain: Not on file  . Food insecurity:    Worry: Not on file    Inability: Not on file  . Transportation needs:    Medical: Not on file    Non-medical: Not on file  Tobacco Use  . Smoking status: Never Smoker  . Smokeless tobacco: Never Used  Substance and Sexual Activity  . Alcohol use: No  . Drug use: Not Currently    Types: Marijuana    Comment: occasionally  . Sexual activity: Yes    Birth control/protection: None  Lifestyle  . Physical activity:    Days per week: Not on file    Minutes per session: Not on file  . Stress: Not on file  Relationships  . Social connections:    Talks on phone: Not on file    Gets together: Not on file    Attends religious service:  Not on file    Active member of club or organization: Not on file    Attends meetings of clubs or organizations: Not on file    Relationship status: Not on file  Other Topics Concern  . Not on file  Social History Narrative  . Not on file    Family History: Family History  Problem Relation Age of Onset  . Hypertension Father     Allergies: No Known Allergies  Medications Prior to Admission  Medication Sig Dispense Refill Last Dose  . Prenatal Vit-Fe Fumarate-FA (PRENATAL MULTIVITAMIN) TABS tablet Take 1 tablet by mouth daily at 12 noon.   Taking     Review of Systems  All systems reviewed and negative except as stated in HPI  Blood pressure 125/78, pulse 93, temperature 98.2 F (36.8 C), temperature source Oral, resp. rate 20, height 5\' 1"  (1.549 m), weight 76.7 kg, last menstrual period 01/28/2017. General appearance: well-appearing, uncomfortable appearing with contractions Lungs: no respiratory distress Heart: regular rate  Abdomen: soft, non-tender; gravid Pelvic: deferred Extremities: Homans sign is negative, no sign  of DVT Presentation: cephalic Fetal monitoring: Category I - 125/mod/+a/-d Uterine activity: regular contractions every 2-3 minutes  Dilation: 9 Effacement (%): 90 Station: -2 Exam by:: Ramin Zoll, L. MD  Prenatal labs: ABO, Rh: --/--/O POS (10/01 0030) Antibody: NEG (10/01 0030) Rubella: Nonimmune (03/21 0000) RPR: Nonreactive (03/21 0000)  HBsAg: Negative (03/21 0000)  HIV: Non-reactive (03/21 0000)  GBS: Negative (09/17 0000)  Glucola: passed 1hr  Genetic screening:  Normal first trimester screen, normal CF screen, HgB electrophoresis   Prenatal Transfer Tool  Maternal Diabetes: No Genetic Screening: Normal Maternal Ultrasounds/Referrals: resolved renal pyelectasis  Fetal Ultrasounds or other Referrals:  None Maternal Substance Abuse:  No Significant Maternal Medications:  None Significant Maternal Lab Results: None  Results for orders  placed or performed during the hospital encounter of 10/23/17 (from the past 24 hour(s))  CBC   Collection Time: 10/24/17 12:30 AM  Result Value Ref Range   WBC 12.7 (H) 4.0 - 10.5 K/uL   RBC 3.85 (L) 3.87 - 5.11 MIL/uL   Hemoglobin 12.6 12.0 - 15.0 g/dL   HCT 40.9 (L) 81.1 - 91.4 %   MCV 92.5 78.0 - 100.0 fL   MCH 32.7 26.0 - 34.0 pg   MCHC 35.4 30.0 - 36.0 g/dL   RDW 78.2 95.6 - 21.3 %   Platelets 252 150 - 400 K/uL  Type and screen [facility]   Collection Time: 10/24/17 12:30 AM  Result Value Ref Range   ABO/RH(D) O POS    Antibody Screen NEG    Sample Expiration      10/27/2017 Performed at [facility], [address], [place], [place] [address]     Patient Active Problem List   Diagnosis Date Noted  . Uterine contractions during pregnancy 10/24/2017  . Personal history of (corrected) cleft lip and palate 04/28/2017    Assessment/Plan:  Sandra Castillo is a 22 y.o. G2P0010 at [redacted]w[redacted]d here for spontaneous onset of labor.   Labor: Active, expectant management. Checked on arrival to L&D and has BBOW and now 9cm. Patient undecided about AROM, pain control options. Trying to labor as naturally as possible.  Fetal Wellbeing: EFW 8 by Leopold's. Cephalic by sutures.  -- GBS (-) -- continuous fetal monitoring - category I   Postpartum Planning -- breast/undecided -- RnonIm/[x] Tdap   Sandra Castillo S. Earlene Plater, DO OB/GYN Fellow

## 2017-10-24 NOTE — Lactation Note (Signed)
This note was copied from a baby's chart. Lactation Consultation Note  Patient Name: Sandra Castillo [id number] Date: 10/24/2017 Reason for consult: Initial assessment;Primapara;1st time breastfeeding;Early term 80-38.6wks Baby is 10 hours old,  As LC entered the room , baby laying in bed wrapped in blanket, asleep, mom awake.  Mom mentioned she had recently pumped around 1220p  and got drops and finger fed baby.  Baby presently sound asleep and not showing signs of hunger.  LC reviewed with mom potential feeding behaviors with a 38 3/7 week infant, also with a  Challenging tissue to latch ( Mom aware both her nipples are semi inverted ) . Mom showed her  Nipples to [facility] and LC had mom compress areolas, both semi compressible. LC reassured mom  With the use of shells between feedings/ and consistent pumping with DEBP will increase hormones,  Use of NS for latching will improve the latch.  LC reviewed supply and demand. LC recommended and encouraged mom to call with feeding cues so LC could see how the baby is feeding.  Mother informed of post-discharge support and given phone number to the lactation department, including services for phone call assistance; out-patient appointments; and breastfeeding support group. List of other breastfeeding resources in the community given in the handout. Encouraged mother to call for problems or concerns related to breastfeeding.   Maternal Data Has patient been taught Hand Expression?: Yes Does the patient have breastfeeding experience prior to this delivery?: No  Feeding Feeding Type: (per mom recently finger fed drops )  LATCH Score                   Interventions Interventions: Breast feeding basics reviewed  Lactation Tools Discussed/Used Tools: Shells;Pump(RN started the pump , LC added the Shells ) Nipple shield size: 20;Other (comment)(RN started the NS ) Shell Type: Inverted Breast pump type: Double-Electric Breast  Pump;Manual Pump Review: Setup, frequency, and cleaning;Milk Storage Initiated by:: Dolly Rias  Date initiated:: 10/24/17   Consult Status Consult Status: Follow-up Date: 10/24/17 Follow-up type: In-patient    Sandra Castillo 10/24/2017, 1:17 PM

## 2017-10-24 NOTE — MAU Note (Signed)
Pt c/o contractions all day that worsened over the past 3 hours. Denies any vag bleeding or leaking. States baby is moving.

## 2017-10-25 LAB — HEMOGLOBIN AND HEMATOCRIT, BLOOD
HCT: 29.9 % — ABNORMAL LOW (ref 36.0–46.0)
HEMOGLOBIN: 10.4 g/dL — AB (ref 12.0–15.0)

## 2017-10-25 LAB — BIRTH TISSUE RECOVERY COLLECTION (PLACENTA DONATION)

## 2017-10-25 MED ORDER — MEASLES, MUMPS & RUBELLA VAC ~~LOC~~ INJ
0.5000 mL | INJECTION | Freq: Once | SUBCUTANEOUS | Status: AC
  Administered 2017-10-26: 0.5 mL via SUBCUTANEOUS
  Filled 2017-10-25 (×2): qty 0.5

## 2017-10-25 NOTE — Lactation Note (Addendum)
This note was copied from a baby's chart. Lactation Consultation Note  Patient Name: Sandra Castillo [id number] Date: 10/25/2017 Reason for consult: Follow-up assessment;Difficult latch  Visited with P1 Mom of ET baby at 40 hrs old.  Baby has been latching on and off using a nipple shield.  Mom has supplemented baby with her EBM by curved tip syringe and spoon.  Baby has been receiving 5 ml last 3 times.   Mom had just double pumped for first time in about 8 hrs.  Obtained 6 ml of colostrum.    Offered to assist with positioning and latching using nipple shield if needed, and colostrum in the shield.  Mom declined assist.  Demonstrated and assisted with curved tip syringe feeding, which baby tolerated well while sucking on FOB's finger.  Baby does have a tight suck, and small mouth.    Baby dressed and swaddled in blanket.  Recommended unwrapping baby, and keeping baby STS with Mom as much as possible.   Recommended increasing her pumping frequency as well, to every 2-3 hrs.   Volume guidelines for supplementation shared.  Baby to receive 7-12 ml at each feeding in 2nd day of life.  Talked about adding formula to EBM, or pumping more frequently.    Mom not happy with pumping, but declined assist at the breast.  Reassured Mom that we wanted to support her feeding choice.  Mom aware she can call for assistance prn.  Talked about paced bottle feeding as volume increases.  Mom not appearing to listen, so decided to end consult for now.   FOB appreciative.  Wrote plan on dry erase board.   WIC referral faxed  Plan- 1- Keep baby STS as much as possible 2- Offer breast when baby cues to eat.  Ask for assistance prn. 3- If unable to attain a deep latch to breast, Mom to double pump on initiation setting, and offer baby her EBM+/formula per volume guidelines 4- Ask for assistance prn.   Consult Status Consult Status: Follow-up Date: 10/26/17 Follow-up type: In-patient    Sandra Castillo 10/25/2017, 1:40 PM

## 2017-10-25 NOTE — Plan of Care (Signed)
Mother is motivated to breastfeed and is working with nursing staff and lactation to progress. Infant having a difficult time with latch. Mother is spoon feeding, syringe feeding, pumping. Lactation Consultant has discussed supplementing EBM with formula to meet infant's calorie need. Bili level has increased and serum bili is pending.

## 2017-10-25 NOTE — Progress Notes (Signed)
Post Partum Day 1 Subjective: no complaints, up ad lib, voiding and tolerating PO  Objective: Blood pressure 110/72, pulse 75, temperature 97.8 F (36.6 C), temperature source Oral, resp. rate 18, height 5\' 1"  (1.549 m), weight 76.7 kg, last menstrual period 01/28/2017, SpO2 98 %, unknown if currently breastfeeding.  Physical Exam:  General: alert, cooperative and no distress Lochia: appropriate Uterine Fundus: firm Incision: n/a DVT Evaluation: No evidence of DVT seen on physical exam.  Recent Labs    10/24/17 0030 10/25/17 0558  HGB 12.6 10.4*  HCT 35.6* 29.9*    Assessment/Plan: Plan for discharge tomorrow and Breastfeeding   LOS: 1 day   Wynelle Bourgeois 10/25/2017, 8:04 AM

## 2017-10-25 NOTE — Plan of Care (Signed)
Progressing appropriately. Encouraged to call for assistance as needed, and for LATCH assessment.  

## 2017-10-25 NOTE — Progress Notes (Signed)
POSTPARTUM PROGRESS NOTE  Post Partum Day 1 Subjective:  Sandra Castillo is a 22 y.o. G2P1011 [redacted]w[redacted]d s/p SVD with 2nd degree periurethral lac, and PPH of 785cc.  No acute events overnight.  Pt denies problems with ambulating, voiding or po intake.  She denies nausea or vomiting.  Pain is well controlled.  She is unsure about flatus. She has not had bowel movement.  Lochia Small.   Objective: Blood pressure 110/72, pulse 75, temperature 97.8 F (36.6 C), temperature source Oral, resp. rate 18, height 5\' 1"  (1.549 m), weight 76.7 kg, last menstrual period 01/28/2017, SpO2 98 %, unknown if currently breastfeeding.  Physical Exam:  General: alert, cooperative and no distress Lochia:normal flow Chest: CTAB Heart: RRR no m/r/g Abdomen: +BS, soft, nontender  Uterine Fundus: firm DVT Evaluation: No calf swelling or tenderness Extremities: no bilateral LE edema  Recent Labs    10/24/17 0030 10/25/17 0558  HGB 12.6 10.4*  HCT 35.6* 29.9*    Assessment/Plan:  ASSESSMENT: Sandra Castillo is a 22 y.o. G2P1011 [redacted]w[redacted]d s/p  SVD with PPH of 785cc  #Breastfeeding #Contraception: condoms and NFP #Circumcision: outpatient #Hgb: 10.4 from 12.6, no symptoms #ID: Rubella non-immune, needs vaccine + Tdap #Dischargable today or PPD#2    LOS: 1 day   Basilio Cairo, Medical Student 10/25/2017, 9:18 AM

## 2017-10-25 NOTE — Progress Notes (Signed)
Patient reports she has been the flu shot during this hospitalization.

## 2017-10-25 NOTE — Lactation Note (Addendum)
This note was copied from a baby's chart. Lactation Consultation Note  Patient Name: Sandra Castillo  late entry from 10/24/2017 - 1530  Mom never called with feeding cues after initial LC visit during this LC shift.

## 2017-10-26 DIAGNOSIS — Z3A38 38 weeks gestation of pregnancy: Secondary | ICD-10-CM

## 2017-10-26 MED ORDER — IBUPROFEN 600 MG PO TABS: 600.0000 mg | ORAL_TABLET | Freq: Four times a day (QID) | ORAL | 0 refills | Status: AC | PRN

## 2017-10-26 NOTE — Lactation Note (Signed)
This note was copied from a baby's chart. Lactation Consultation Note  Patient Name: Sandra Castillo [id number] Date: 10/26/2017 Reason for consult: Follow-up assessment;1st time breastfeeding;Primapara;Early term 37-38.6wks  P1 mother whose infant is now 70 hours old.  Mother is planning on discharge later today.  RN had requested my assistance to obtain a [facility] loaner pump for mother.  Mother has no questions/concerns related to breast feeding.  She has been feeding, pumping and supplementing with a curved tip syringe.  She is also using a NS.    Engorgement prevention/treatment discussed with mother.  [facility] loaner pump # [id number] provided and form with $30 cash inserted into [name]'s mailbox in office.  Mother verbalized understanding regarding pump return, cost if not returned and location to return.  She is planning on obtaining a DEBP from [facility].  She has our OP phone number to call if questions/concerns arise after discharge.  Family present and supportive.  RN updated.   Maternal Data Formula Feeding for Exclusion: No Has patient been taught Hand Expression?: Yes Does the patient have breastfeeding experience prior to this delivery?: No  Feeding Feeding Type: Breast Milk  LATCH Score                   Interventions    Lactation Tools Discussed/Used WIC Program: Yes Initiated by:: Already initiated   Consult Status Consult Status: Complete Date: 10/26/17 Follow-up type: Call as needed    Kellene Mccleary R Miller L[place]house 10/26/2017, 1:38 PM

## 2017-10-26 NOTE — Discharge Instructions (Signed)
Vaginal Delivery, Care After °Refer to this sheet in the next few weeks. These instructions provide you with information about caring for yourself after vaginal delivery. Your health care provider may also give you more specific instructions. Your treatment has been planned according to current medical practices, but problems sometimes occur. Call your health care provider if you have any problems or questions. °What can I expect after the procedure? °After vaginal delivery, it is common to have: °· Some bleeding from your vagina. °· Soreness in your abdomen, your vagina, and the area of skin between your vaginal opening and your anus (perineum). °· Pelvic cramps. °· Fatigue. ° °Follow these instructions at home: °Medicines °· Take over-the-counter and prescription medicines only as told by your health care provider. °· If you were prescribed an antibiotic medicine, take it as told by your health care provider. Do not stop taking the antibiotic until it is finished. °Driving ° °· Do not drive or operate heavy machinery while taking prescription pain medicine. °· Do not drive for 24 hours if you received a sedative. °Lifestyle °· Do not drink alcohol. This is especially important if you are breastfeeding or taking medicine to relieve pain. °· Do not use tobacco products, including cigarettes, chewing tobacco, or e-cigarettes. If you need help quitting, ask your health care provider. °Eating and drinking °· Drink at least 8 eight-ounce glasses of water every day unless you are told not to by your health care provider. If you choose to breastfeed your baby, you may need to drink more water than this. °· Eat high-fiber foods every day. These foods may help prevent or relieve constipation. High-fiber foods include: °? Whole grain cereals and breads. °? Brown rice. °? Beans. °? Fresh fruits and vegetables. °Activity °· Return to your normal activities as told by your health care provider. Ask your health care provider  what activities are safe for you. °· Rest as much as possible. Try to rest or take a nap when your baby is sleeping. °· Do not lift anything that is heavier than your baby or 10 lb (4.5 kg) until your health care provider says that it is safe. °· Talk with your health care provider about when you can engage in sexual activity. This may depend on your: °? Risk of infection. °? Rate of healing. °? Comfort and desire to engage in sexual activity. °Vaginal Care °· If you have an episiotomy or a vaginal tear, check the area every day for signs of infection. Check for: °? More redness, swelling, or pain. °? More fluid or blood. °? Warmth. °? Pus or a bad smell. °· Do not use tampons or douches until your health care provider says this is safe. °· Watch for any blood clots that may pass from your vagina. These may look like clumps of dark red, brown, or black discharge. °General instructions °· Keep your perineum clean and dry as told by your health care provider. °· Wear loose, comfortable clothing. °· Wipe from front to back when you use the toilet. °· Ask your health care provider if you can shower or take a bath. If you had an episiotomy or a perineal tear during labor and delivery, your health care provider may tell you not to take baths for a certain length of time. °· Wear a bra that supports your breasts and fits you well. °· If possible, have someone help you with household activities and help care for your baby for at least a few days after   you leave the hospital. °· Keep all follow-up visits for you and your baby as told by your health care provider. This is important. °Contact a health care provider if: °· You have: °? Vaginal discharge that has a bad smell. °? Difficulty urinating. °? Pain when urinating. °? A sudden increase or decrease in the frequency of your bowel movements. °? More redness, swelling, or pain around your episiotomy or vaginal tear. °? More fluid or blood coming from your episiotomy or  vaginal tear. °? Pus or a bad smell coming from your episiotomy or vaginal tear. °? A fever. °? A rash. °? Little or no interest in activities you used to enjoy. °? Questions about caring for yourself or your baby. °· Your episiotomy or vaginal tear feels warm to the touch. °· Your episiotomy or vaginal tear is separating or does not appear to be healing. °· Your breasts are painful, hard, or turn red. °· You feel unusually sad or worried. °· You feel nauseous or you vomit. °· You pass large blood clots from your vagina. If you pass a blood clot from your vagina, save it to show to your health care provider. Do not flush blood clots down the toilet without having your health care provider look at them. °· You urinate more than usual. °· You are dizzy or light-headed. °· You have not breastfed at all and you have not had a menstrual period for 12 weeks after delivery. °· You have stopped breastfeeding and you have not had a menstrual period for 12 weeks after you stopped breastfeeding. °Get help right away if: °· You have: °? Pain that does not go away or does not get better with medicine. °? Chest pain. °? Difficulty breathing. °? Blurred vision or spots in your vision. °? Thoughts about hurting yourself or your baby. °· You develop pain in your abdomen or in one of your legs. °· You develop a severe headache. °· You faint. °· You bleed from your vagina so much that you fill two sanitary pads in one hour. °This information is not intended to replace advice given to you by your health care provider. Make sure you discuss any questions you have with your health care provider. °Document Released: 01/08/2000 Document Revised: 06/24/2015 Document Reviewed: 01/25/2015 °Elsevier Interactive Patient Education © 2018 Elsevier Inc. ° °

## 2017-10-26 NOTE — Discharge Summary (Signed)
    OB Discharge Summary     Patient Name: Sandra Castillo DOB: 05/06/1995 MRN: [id number]  Date of admission: 10/23/2017 Delivering MD: Tamera Stands   Date of discharge: 10/26/2017  Admitting diagnosis: 38wks ctx Intrauterine pregnancy: [redacted]w[redacted]d     Secondary diagnosis:  Active Problems:   Uterine contractions during pregnancy   Indication for care in labor or delivery  Additional problems: resolved fetal pyelectasis, Rubella non-immune     Discharge diagnosis: Term Pregnancy Delivered                                                                                                Post partum procedures:none  Augmentation: AROM and Pitocin  Complications: None  Hospital course:  Onset of Labor With Vaginal Delivery     22 y.o. yo G2P1011 at [redacted]w[redacted]d was admitted in Active Labor on 10/23/2017. Patient had an uncomplicated labor course as follows:  Membrane Rupture Time/Date: 2:40 AM ,10/24/2017   Intrapartum Procedures: Episiotomy: None [1]                                         Lacerations:  Periurethral [8];2nd degree [3]  Patient had a delivery of a Viable infant. 10/24/2017  Information for the patient's newborn:  Shanetta, Nicolls [[id number]]  Delivery Method: Vaginal, Spontaneous(Filed from Delivery Summary)    Pateint had an uncomplicated postpartum course.  She is ambulating, tolerating a regular diet, passing flatus, and urinating well. Patient is discharged home in stable condition on 10/26/17.   Physical exam  Vitals:   10/25/17 0548 10/25/17 1450 10/25/17 2246 10/26/17 0533  BP: 110/72 117/75 122/76 116/77  Pulse: 75 81 85 75  Resp: 18 18 18 17   Temp: 97.8 F (36.6 C) 98.4 F (36.9 C) 97.8 F (36.6 C)   TempSrc: Oral Oral Oral   SpO2:   100%   Weight:      Height:       General: alert, cooperative and no distress Lochia: appropriate Uterine Fundus: firm Incision: N/A DVT Evaluation: No evidence of DVT seen on physical exam. No significant calf/ankle  edema. Labs: Lab Results  Component Value Date   WBC 12.7 (H) 10/24/2017   HGB 10.4 (L) 10/25/2017   HCT 29.9 (L) 10/25/2017   MCV 92.5 10/24/2017   PLT 252 10/24/2017   No flowsheet data found.  Discharge instruction: per After Visit Summary and "Baby and Me Booklet".  After visit meds:    Diet: routine diet  Activity: Advance as tolerated. Pelvic rest for 6 weeks.   Outpatient follow up:6 weeks Follow up Appt:No future appointments. Follow up Visit:No follow-ups on file.  Postpartum contraception: Condoms  Newborn Data: Live born female  Birth Weight: 6 lb 15.5 oz (3161 g) APGAR: 9, 9  Newborn Delivery   Birth date/time:  10/24/2017 03:09:00 Delivery type:  Vaginal, Spontaneous     Baby Feeding: Bottle and Breast Disposition:home with mother   10/26/2017 Basilio Cairo, Medical Student

## 2019-02-07 ENCOUNTER — Other Ambulatory Visit: Payer: Self-pay

## 2019-06-28 IMAGING — US US MFM FETAL NUCHAL TRANSLUCENCY
1 series · 15 of 28 positions shown · non-contrast
Comparison: none

[Series 1: us mfm fetal nuchal translucency · 15 of 34 slices shown]
[im 1/34]
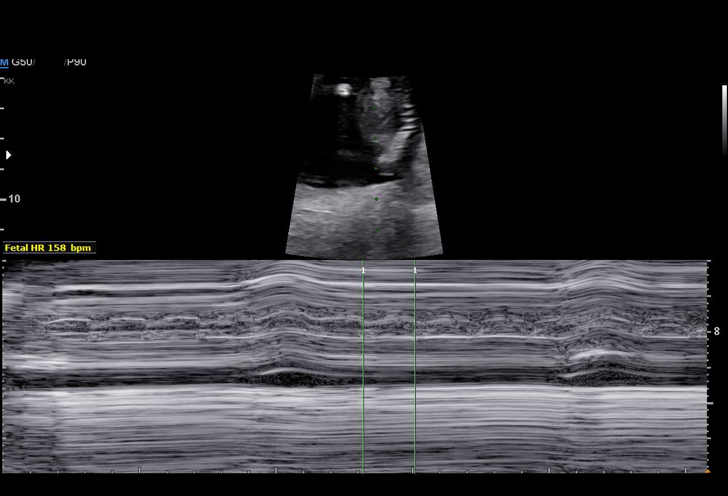
[im 3/34]
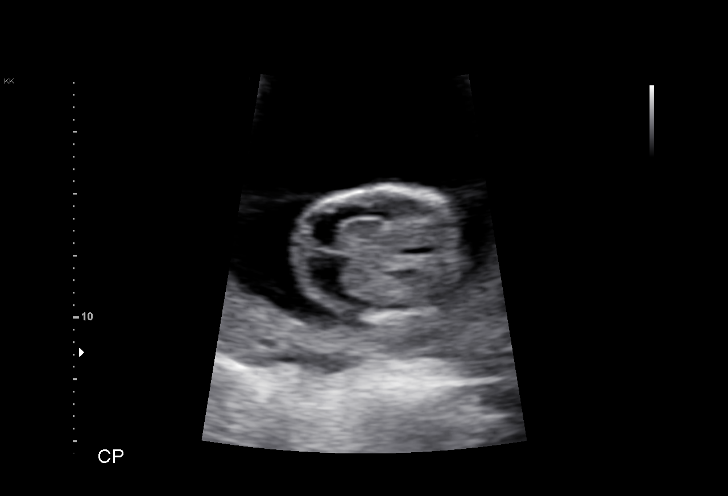
[im 5/34]
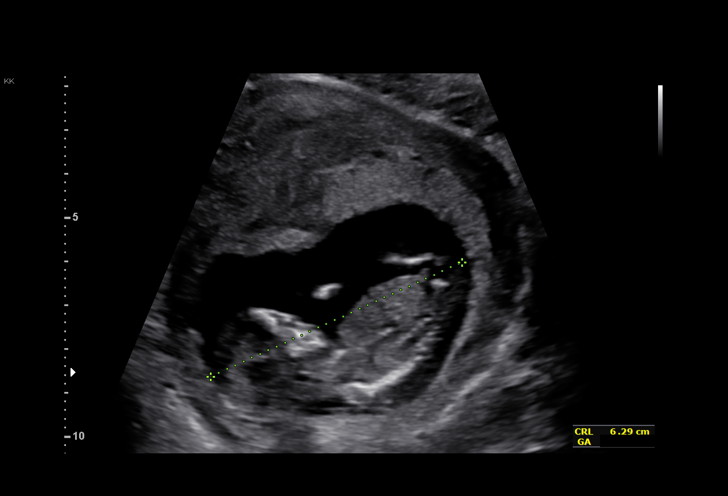
[im 8/34]
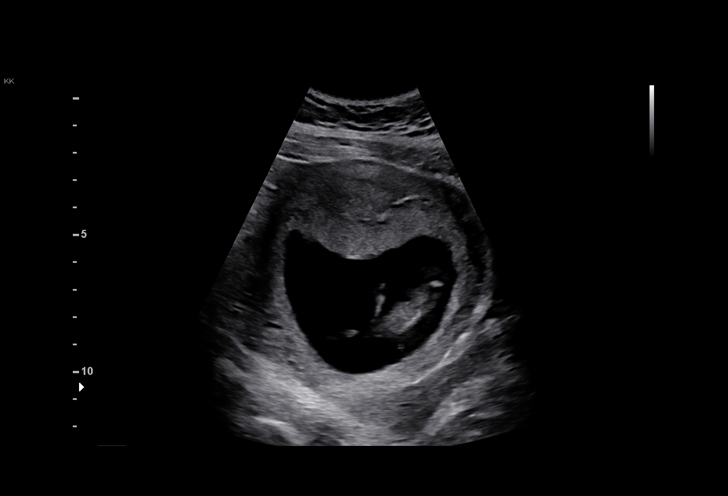
[im 10/34]
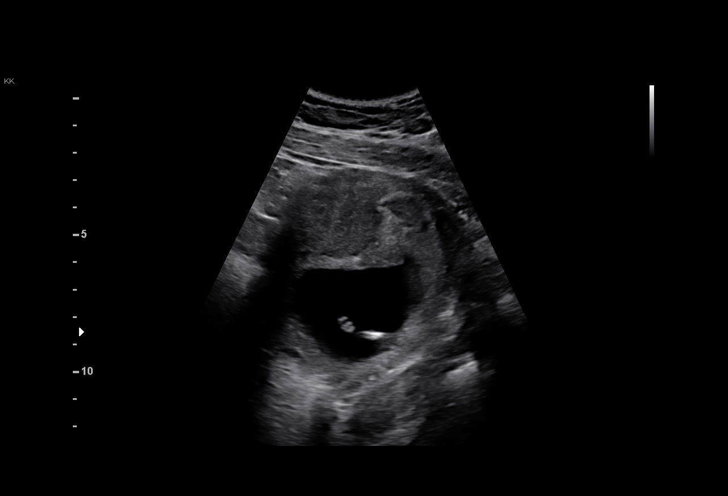
[im 13/34]
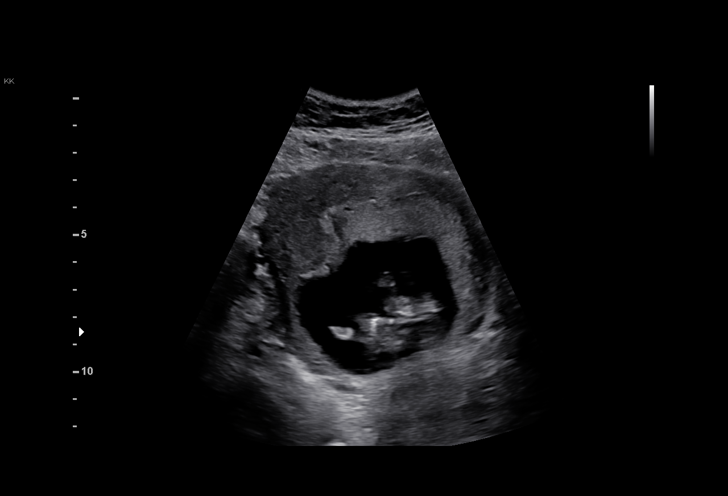
[im 15/34]
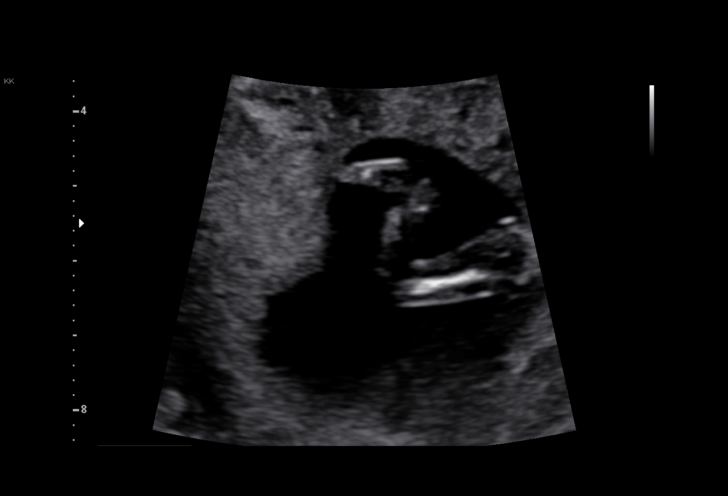
[im 18/34]
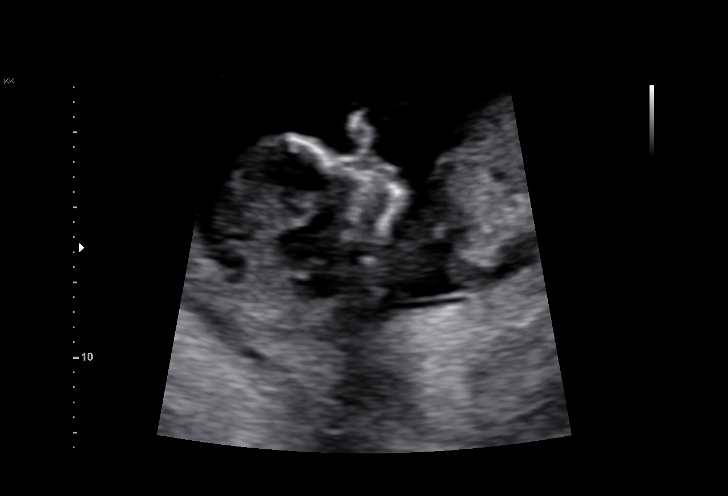
[im 19/34]
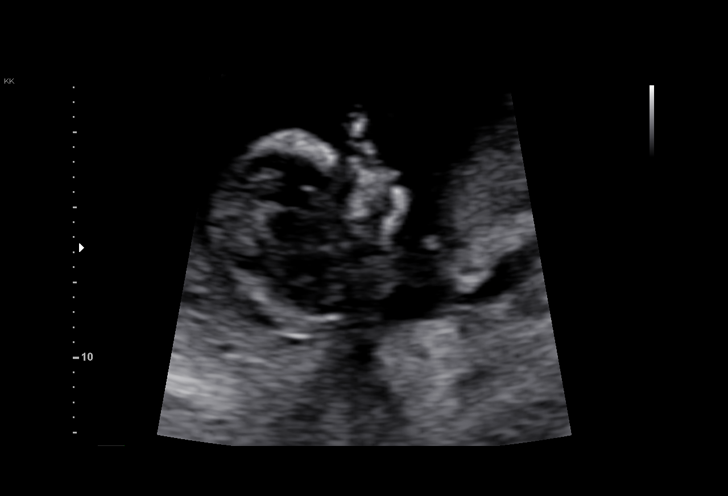
[im 21/34]
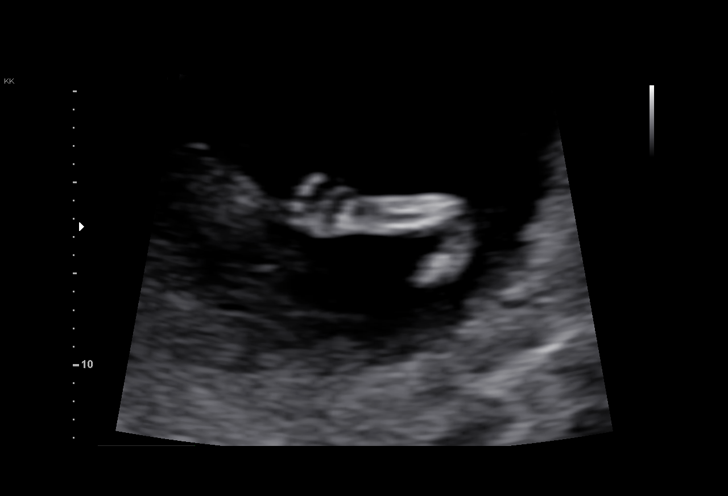
[im 24/34]
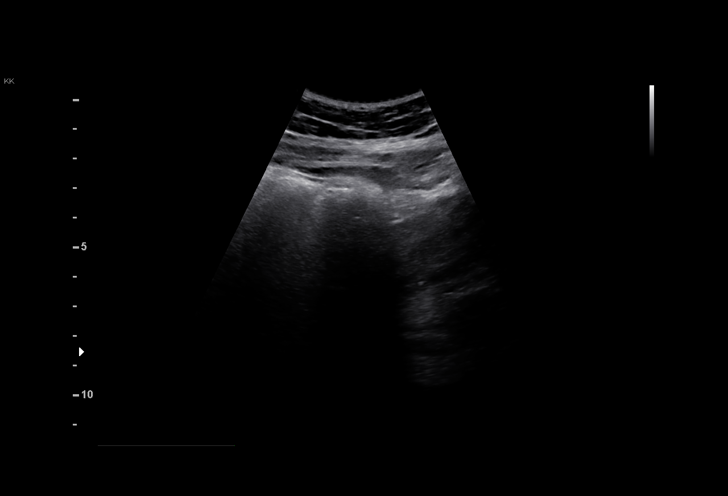
[im 26/34]
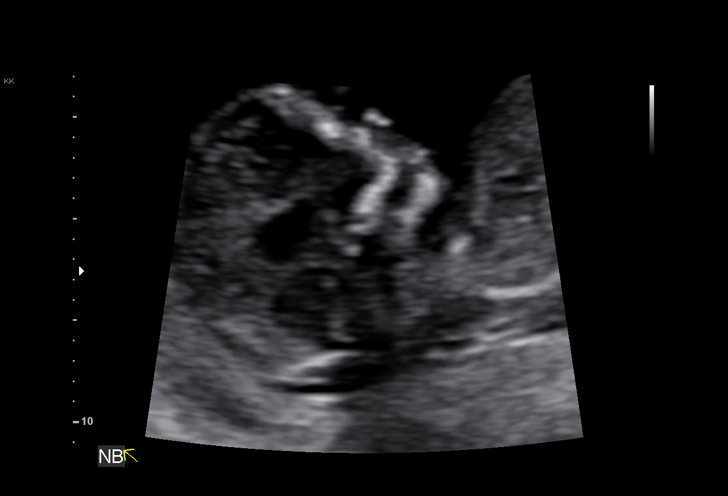
[im 29/34]
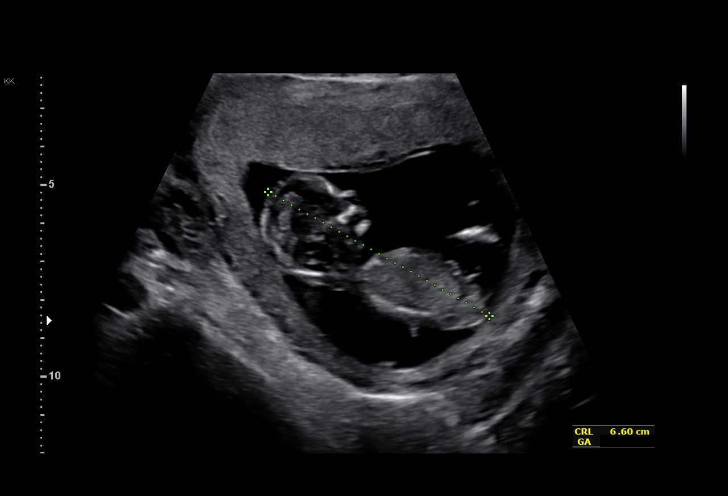
[im 31/34]
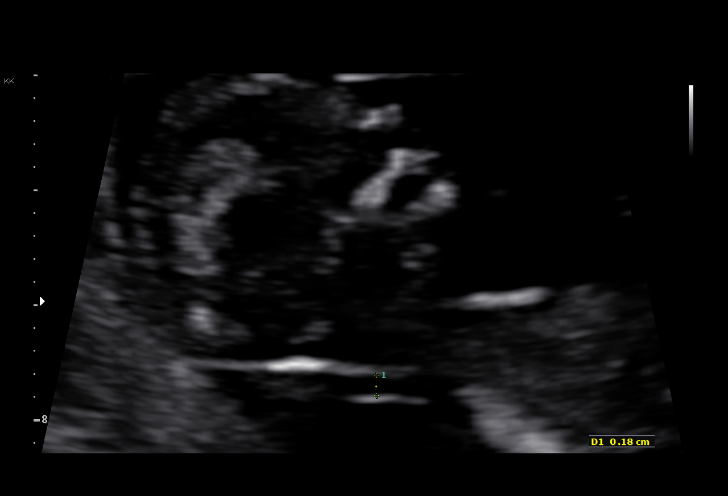
[im 34/34]
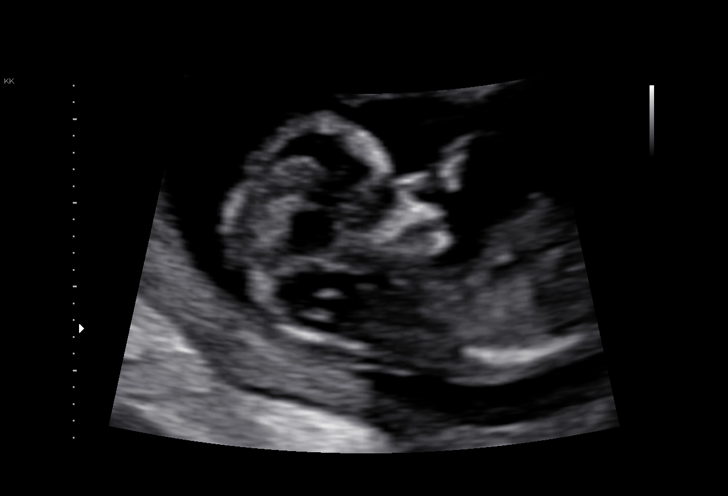

[15 of 28 positions shown; findings below may reference images not displayed]

TRANSLUCENCY

1  [employer]             [id number]      [id number]     [id number]
Indications

12 weeks gestation of pregnancy
Encounter for nuchal translucency
Personal history of congenital abnormality
(Maternal H/O Cleft lip &Pallet
OB History

Blood Type:            Height:  5'1"   Weight (lb):  153       BMI:
Gravidity:    2          SAB:   0
Living:       0
Fetal Evaluation

Num Of Fetuses:     1
Fetal Heart         158
Rate(bpm):
Cardiac Activity:   Observed
Presentation:       Variable

Amniotic Fluid
AFI FV:      Subjectively within normal limits
Biometry

CRL:      64.5  mm     G. Age:  12w 5d                  EDD:   11/05/17
Gestational Age

LMP:           12w 6d        Date:  01/28/17                 EDD:   11/04/17
Best:          12w 6d     Det. By:  LMP  (01/28/17)          EDD:   11/04/17
1st Trimester Genetic Sonogram Screening

CRL:            64.5  mm    G. Age:   12w 5d                 EDD:   11/05/17
Nuc Trans:       1.8  mm
Nasal Bone:                 Present
Impression

IUP at 12+6 weeks here for first trimester screening
Normal fetal cardiac activity
Normal fetal morphology; nasal bone visualized
CRL confirms established dating criteria
Nuchal translucency measures 1.8 mm
Recommendations

Serum analytes to be drawn today. Reommend anatomic
survey between 18-22 weeks

## 2020-07-21 ENCOUNTER — Emergency Department (HOSPITAL_COMMUNITY): Payer: Self-pay

## 2020-07-21 ENCOUNTER — Encounter (HOSPITAL_COMMUNITY): Payer: Self-pay

## 2020-07-21 ENCOUNTER — Emergency Department (HOSPITAL_COMMUNITY)
Admission: EM | Admit: 2020-07-21 | Discharge: 2020-07-21 | Disposition: A | Discharge status: Home / Self Care | Payer: Self-pay | Attending: Emergency Medicine | Admitting: Emergency Medicine

## 2020-07-21 ENCOUNTER — Other Ambulatory Visit: Payer: Self-pay

## 2020-07-21 DIAGNOSIS — S62662A Nondisplaced fracture of distal phalanx of right middle finger, initial encounter for closed fracture: Secondary | ICD-10-CM | POA: Insufficient documentation

## 2020-07-21 DIAGNOSIS — S62639B Displaced fracture of distal phalanx of unspecified finger, initial encounter for open fracture: Secondary | ICD-10-CM

## 2020-07-21 DIAGNOSIS — Z23 Encounter for immunization: Secondary | ICD-10-CM | POA: Insufficient documentation

## 2020-07-21 DIAGNOSIS — W230XXA Caught, crushed, jammed, or pinched between moving objects, initial encounter: Secondary | ICD-10-CM | POA: Insufficient documentation

## 2020-07-21 MED ORDER — AMOXICILLIN-POT CLAVULANATE 875-125 MG PO TABS: 1.0000 | ORAL_TABLET | Freq: Two times a day (BID) | ORAL | 0 refills | Status: AC

## 2020-07-21 MED ORDER — BACITRACIN ZINC 500 UNIT/GM EX OINT
TOPICAL_OINTMENT | Freq: Two times a day (BID) | CUTANEOUS | Status: DC
  Administered 2020-07-21: 1 via TOPICAL
  Filled 2020-07-21: qty 1.8

## 2020-07-21 MED ORDER — TETANUS-DIPHTH-ACELL PERTUSSIS 5-2.5-18.5 LF-MCG/0.5 IM SUSY
0.5000 mL | PREFILLED_SYRINGE | Freq: Once | INTRAMUSCULAR | Status: AC
  Administered 2020-07-21: 0.5 mL via INTRAMUSCULAR
  Filled 2020-07-21: qty 0.5

## 2020-07-21 MED ORDER — AMOXICILLIN-POT CLAVULANATE 875-125 MG PO TABS
1.0000 | ORAL_TABLET | Freq: Once | ORAL | Status: AC
  Administered 2020-07-21: 1 via ORAL
  Filled 2020-07-21: qty 1

## 2020-07-21 NOTE — ED Triage Notes (Signed)
Pt c/o R middle digit pain after smashing it in between a cart and the wall.  Pain score 1/10.  Bleeding noted around nail bed.

## 2020-07-21 NOTE — ED Provider Notes (Signed)
Emergency Medicine Provider Triage Evaluation Note  Sandra Castillo , a 25 y.o. female  was evaluated in triage.  Pt complains of right middle finger pain.  Patient works at [employer], was moving a cart, she accidentally crushed her middle finger between the cart in the wall.  She has notable bruising and some petechiae to the DIP of the right middle finger, denies any numbness or tingling.  Review of Systems  Positive: As above Negative: As above  Physical Exam  BP (!) 148/92 (BP Location: Left Arm)   Pulse (!) 104   Temp 98 F (36.7 C) (Oral)   Resp 18   SpO2 100%  Gen:   Awake, no distress   Resp:  Normal effort  MSK:   Moves extremities without difficulty  Other:  Mild bruising and some petechiae noted to the DIP.  She does have some dried up blood around the nailbed but no visible nailbed lacerations.  Medical Decision Making  Medically screening exam initiated at 5:56 PM.  Appropriate orders placed.  Azalie Dovel was informed that the remainder of the evaluation will be completed by another provider, this initial triage assessment does not replace that evaluation, and the importance of remaining in the ED until their evaluation is complete.     Mare Ferrari, PA-C 07/21/20 Randolm Idol, MD 07/22/20 [phone]

## 2020-07-21 NOTE — ED Provider Notes (Signed)
[facility]-EMERGENCY DEPT Provider Note   CSN: [id number] Arrival date & time: 07/21/20  1741     History Chief Complaint  Patient presents with   Finger Injury    Felisha Claytor is a 25 y.o. female who presents with concern for injury to her right middle finger after accidentally smashing between a metal cart and the wall.  Very minimal pain but does endorse some bleeding.  Patient not up-to-date on her tetanus vaccine, history of cleft palate repair, otherwise carries no medical diagnoses and is not on medications every day.  HPI     Past Medical History:  Diagnosis Date   Medical history non-contributory     Patient Active Problem List   Diagnosis Date Noted   Uterine contractions during pregnancy 10/24/2017   Indication for care in labor or delivery 10/24/2017   Personal history of (corrected) cleft lip and palate 04/28/2017    Past Surgical History:  Procedure Laterality Date   CLEFT PALATE REPAIR       OB History     Gravida  2   Para  1   Term  1   Preterm      AB  1   Living  1      SAB  1   IAB      Ectopic      Multiple  0   Live Births  1           Family History  Problem Relation Age of Onset   Hypertension Father     Social History   Tobacco Use   Smoking status: Never   Smokeless tobacco: Never  Vaping Use   Vaping Use: Never used  Substance Use Topics   Alcohol use: Yes    Comment: occ   Drug use: Not Currently    Types: Marijuana    Comment: occasionally    Home Medications Prior to Admission medications   Medication Sig Start Date End Date Taking? Authorizing Provider  amoxicillin-clavulanate (AUGMENTIN) 875-125 MG tablet Take 1 tablet by mouth 2 (two) times daily for 5 days. 07/21/20 07/26/20 Yes Merrill Deanda, Lupe Carney R, PA-C  ibuprofen (ADVIL,MOTRIN) 600 MG tablet Take 1 tablet (600 mg total) by mouth every 6 (six) hours as needed. 10/26/17   Arabella Merles, CNM  Prenatal  Vit-Fe Fumarate-FA (PRENATAL MULTIVITAMIN) TABS tablet Take 1 tablet by mouth daily at 12 noon.    [provider]    Allergies    Patient has no known allergies.  Review of Systems   Review of Systems  Constitutional: Negative.   HENT: Negative.    Respiratory: Negative.    Cardiovascular: Negative.   Gastrointestinal: Negative.   Skin:  Positive for wound.   Physical Exam Updated Vital Signs BP 121/85 (BP Location: Right Arm)   Pulse 85   Temp 98.7 F (37.1 C) (Oral)   Resp 16   LMP 07/08/2020   SpO2 100%   Physical Exam Vitals and nursing note reviewed.  HENT:     Head: Normocephalic and atraumatic.  Eyes:     General: No scleral icterus.       Right eye: No discharge.        Left eye: No discharge.     Conjunctiva/sclera: Conjunctivae normal.  Pulmonary:     Effort: Pulmonary effort is normal.  Musculoskeletal:       Hands:     Comments: FROM of all 5 digits of the right hand, 2+ radial pulse  Skin:    General: Skin is warm and dry.     Capillary Refill: Capillary refill takes less than 2 seconds.     Findings: Wound present.  Neurological:     General: No focal deficit present.     Mental Status: She is alert.  Psychiatric:        Mood and Affect: Mood normal.    ED Results / Procedures / Treatments   Labs (all labs ordered are listed, but only abnormal results are displayed) Labs Reviewed - No data to display  EKG None  Radiology DG Finger Middle Right  Result Date: 07/21/2020 CLINICAL DATA:  Status post trauma. EXAM: RIGHT MIDDLE FINGER 2+V COMPARISON:  None. FINDINGS: A tiny linear lucency is seen involving the tuft of the distal phalanx of the third right finger. There is no evidence of dislocation. There is no evidence of arthropathy or other focal bone abnormality. Soft tissues are unremarkable. IMPRESSION: Findings suggestive of a tiny nondisplaced fracture of the tuft of the distal phalanx of the third right finger. Electronically  Signed   By: Aram Candela M.D.   On: 07/21/2020 18:41    Procedures Procedures  Medications Ordered in ED Medications  bacitracin ointment (1 application Topical Given 07/21/20 2008)  Tdap (BOOSTRIX) injection 0.5 mL (0.5 mLs Intramuscular Given 07/21/20 2005)  amoxicillin-clavulanate (AUGMENTIN) 875-125 MG per tablet 1 tablet (1 tablet Oral Given 07/21/20 2008)    ED Course  I have reviewed the triage vital signs and the nursing notes.  Pertinent labs & imaging results that were available during my care of the patient were reviewed by me and considered in my medical decision making (see chart for details).  Clinical Course as of 07/22/20 0047  Tue Jul 21, 2020  2023 Consult to Dr. Janee Morn, hand surgery, who agrees with plan for prophylactic antibiotics.  He states he will have his office call the patient tomorrow morning to schedule follow-up appointment.  I appreciate his collaboration in the care of this patient. [RS]    Clinical Course User Index [RS] Jackie Russman, Eugene Gavia, PA-C   MDM Rules/Calculators/A&P                         25 year old female presents with crush injury to the right distal middle finger.  Differential diagnosis includes is limited to soft tissue injury, closed fracture, open fracture, tendinous or ligamentous injury.  Mildly tachycardic on intake, vitals otherwise normal. Tachycardic, exam.  Physical exam revealed 2+ radial pulse on the right, forage motion of all 5 digits of right hand, and normal cap refill in all 5 digits.  There is mild tenderness palpation and bruising to the distal middle finger on the lateral and volar aspects, distal to the DIP.  Nailbed is intact with very mild skin tear at the base of the nailbed on the dorsum of the finger.  Plain film did reveal tuft fracture.  Boostrix administered in first dose of oral antibiotics.  Case discussed with on-call hand surgeon as above.  Patient placed in static splint, will follow up in  outpatient office with hand surgery.  No further work-up warranted in the this time.  Izadora voiced understanding for medical evaluation and treatment plan.  Each of her questions was answered to her expressed satisfaction.  Return precautions given.  Patient is well-appearing, stable, and agreed for discharge this time.  This chart was dictated using voice recognition software, Dragon. Despite the best efforts of this provider  to proofread and correct errors, errors may still occur which can change documentation meaning.  Final Clinical Impression(s) / ED Diagnoses Final diagnoses:  Open fracture of tuft of distal phalanx of finger    Rx / DC Orders ED Discharge Orders          Ordered    amoxicillin-clavulanate (AUGMENTIN) 875-125 MG tablet  2 times daily        07/21/20 2024             Kana Reimann, Eugene Gavia, PA-C 07/22/20 0047    Virgina Norfolk, DO 07/22/20 2318

## 2020-07-21 NOTE — Discharge Instructions (Addendum)
You were seen in the ER today for evaluation of a finger injury or crush injury.  Your physical exam was reassuring, there is not appear to be any injury to your nailbed. Your x-ray did reveal a small fracture at the end of your finger.  Given the overlying cut, there is concern for this being considered in "open fracture".  For this reason you were started on antibiotics to prevent development of infection in your finger.  Please take them as prescribed for the entire course. Below is contact information for Dr. Janee Morn, the hand surgeon with whom you follow-up.  You should expect a call from his office to schedule follow-up appointment tomorrow morning.  If you not hear from them by tomorrow evening, please call them first thing Thursday morning to schedule follow-up.  Please keep the splint in place until you see Dr. Janee Morn, replace it after showers.   Return to the emergency part develop any change in color of the end of your finger, if it becomes numb, if starts to drain puslike discharge, or develop any other new severe symptoms.

## 2022-09-20 IMAGING — DX DG FINGER MIDDLE 2+V*R*
3 series · 3 of 3 positions shown · non-contrast
Comparison: None.

CLINICAL DATA: Status post trauma.

EXAM:
RIGHT MIDDLE FINGER 2+V

[finger ap]
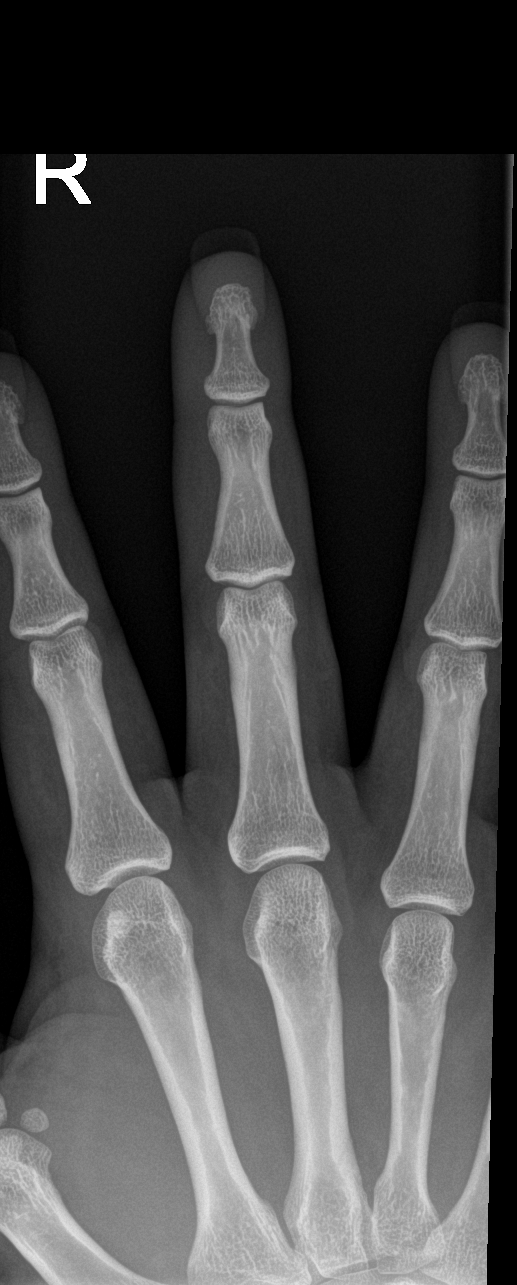

[finger obl]
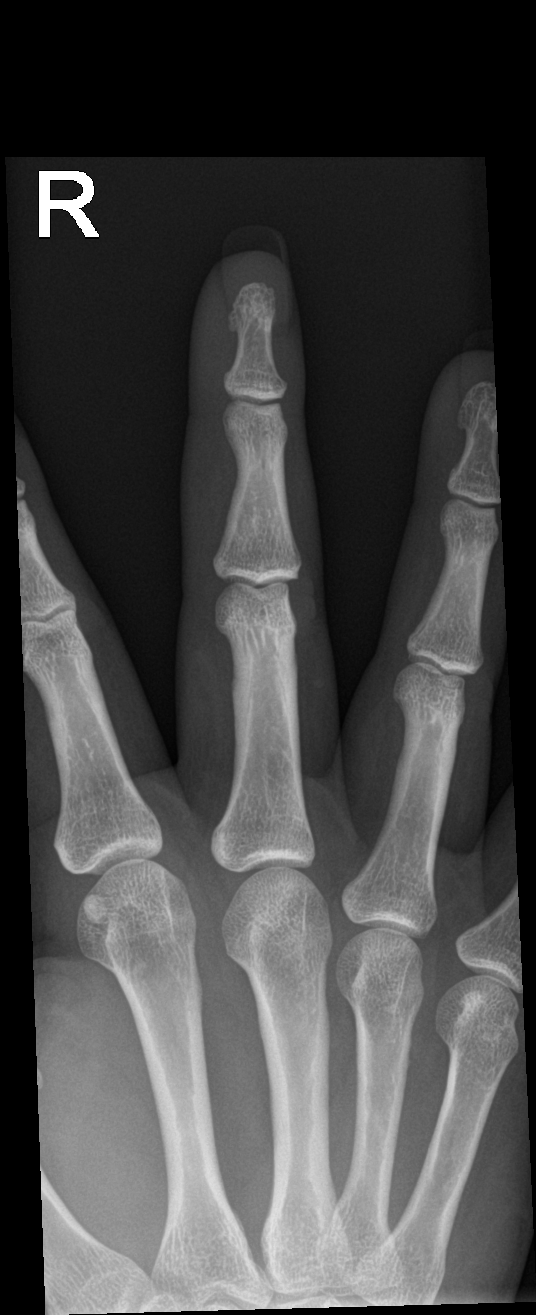

[finger lat]
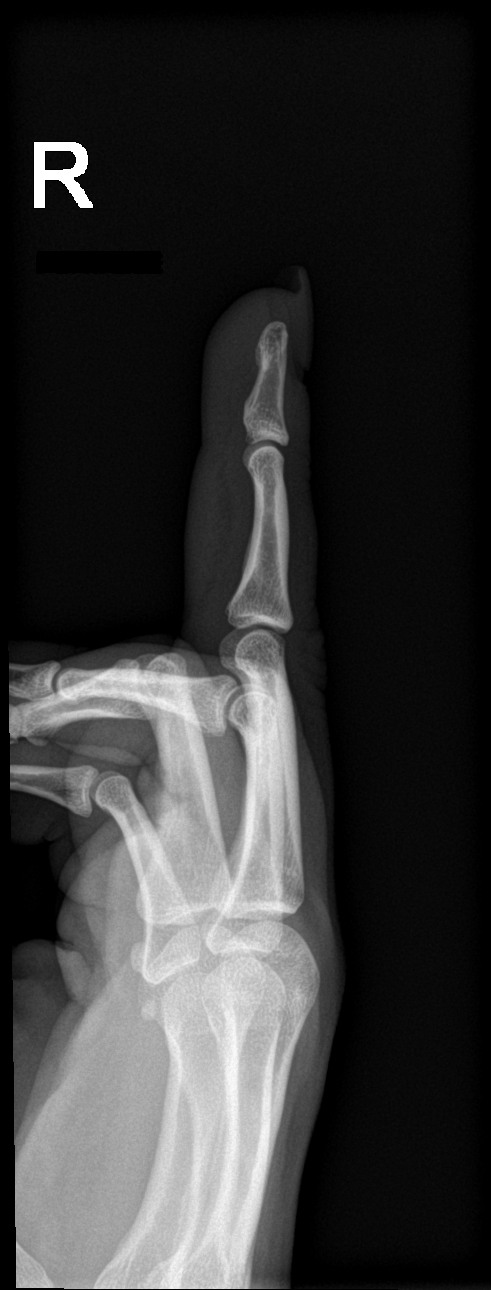

[3 of 3 positions shown; findings below may reference images not displayed]

FINDINGS: A tiny linear lucency is seen involving the tuft of the distal
phalanx of the third right finger. There is no evidence of
dislocation. There is no evidence of arthropathy or other focal bone
abnormality. Soft tissues are unremarkable.
IMPRESSION: Findings suggestive of a tiny nondisplaced fracture of the tuft of
the distal phalanx of the third right finger.
